# Patient Record
Sex: Female | Born: 1985
Health system: Southern US, Community
[De-identification: ages and names within clinical notes are randomized; demographics above are authoritative.]

## PROBLEM LIST (undated history)

## (undated) ENCOUNTER — Inpatient Hospital Stay (HOSPITAL_COMMUNITY): Payer: Self-pay

## (undated) DIAGNOSIS — L509 Urticaria, unspecified: Secondary | ICD-10-CM

## (undated) DIAGNOSIS — W3400XA Accidental discharge from unspecified firearms or gun, initial encounter: Secondary | ICD-10-CM

## (undated) DIAGNOSIS — K219 Gastro-esophageal reflux disease without esophagitis: Secondary | ICD-10-CM

## (undated) DIAGNOSIS — D649 Anemia, unspecified: Secondary | ICD-10-CM

## (undated) DIAGNOSIS — J45909 Unspecified asthma, uncomplicated: Secondary | ICD-10-CM

## (undated) HISTORY — PX: NO PAST SURGERIES: SHX2092

## (undated) HISTORY — DX: Unspecified asthma, uncomplicated: J45.909

## (undated) HISTORY — DX: Urticaria, unspecified: L50.9

## (undated) HISTORY — DX: Accidental discharge from unspecified firearms or gun, initial encounter: W34.00XA

## (undated) HISTORY — PX: THERAPEUTIC ABORTION: SHX798

---
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
## 2007-03-08 ENCOUNTER — Ambulatory Visit: Payer: Self-pay | Admitting: Internal Medicine

## 2007-05-30 ENCOUNTER — Ambulatory Visit: Payer: Self-pay | Admitting: Family Medicine

## 2008-06-19 ENCOUNTER — Ambulatory Visit: Payer: Self-pay | Admitting: Internal Medicine

## 2008-06-20 ENCOUNTER — Encounter (INDEPENDENT_AMBULATORY_CARE_PROVIDER_SITE_OTHER): Payer: Self-pay | Admitting: Internal Medicine

## 2008-09-12 ENCOUNTER — Ambulatory Visit (HOSPITAL_COMMUNITY): Admission: RE | Admit: 2008-09-12 | Discharge: 2008-09-12 | Payer: Self-pay | Admitting: Obstetrics & Gynecology

## 2008-12-04 ENCOUNTER — Inpatient Hospital Stay (HOSPITAL_COMMUNITY): Admission: AD | Admit: 2008-12-04 | Discharge: 2008-12-07 | Payer: Self-pay | Admitting: Obstetrics & Gynecology

## 2008-12-07 ENCOUNTER — Encounter: Payer: Self-pay | Admitting: Obstetrics & Gynecology

## 2009-02-13 ENCOUNTER — Inpatient Hospital Stay (HOSPITAL_COMMUNITY): Admission: AD | Admit: 2009-02-13 | Discharge: 2009-02-15 | Payer: Self-pay | Admitting: Obstetrics & Gynecology

## 2009-09-17 ENCOUNTER — Emergency Department (HOSPITAL_COMMUNITY): Admission: EM | Admit: 2009-09-17 | Discharge: 2009-09-17 | Payer: Self-pay | Admitting: Emergency Medicine

## 2010-04-07 ENCOUNTER — Encounter: Payer: Self-pay | Admitting: Obstetrics & Gynecology

## 2010-06-01 LAB — DIFFERENTIAL
Basophils Absolute: 0 10*3/uL (ref 0.0–0.1)
Basophils Relative: 0 % (ref 0–1)
Eosinophils Relative: 0 % (ref 0–5)
Lymphocytes Relative: 8 % — ABNORMAL LOW (ref 12–46)
Monocytes Absolute: 0.1 10*3/uL (ref 0.1–1.0)
Neutro Abs: 7.3 10*3/uL (ref 1.7–7.7)

## 2010-06-01 LAB — URINALYSIS, ROUTINE W REFLEX MICROSCOPIC
Bilirubin Urine: NEGATIVE
Ketones, ur: 15 mg/dL — AB
Nitrite: NEGATIVE
Protein, ur: 30 mg/dL — AB

## 2010-06-01 LAB — WET PREP, GENITAL
Clue Cells Wet Prep HPF POC: NONE SEEN
Trich, Wet Prep: NONE SEEN
Yeast Wet Prep HPF POC: NONE SEEN

## 2010-06-01 LAB — CBC
HCT: 31.3 % — ABNORMAL LOW (ref 36.0–46.0)
MCHC: 34.2 g/dL (ref 30.0–36.0)
MCV: 92.3 fL (ref 78.0–100.0)
Platelets: 330 10*3/uL (ref 150–400)
RDW: 13 % (ref 11.5–15.5)
WBC: 8.1 10*3/uL (ref 4.0–10.5)

## 2010-06-01 LAB — POCT I-STAT, CHEM 8
Calcium, Ion: 1.17 mmol/L (ref 1.12–1.32)
HCT: 34 % — ABNORMAL LOW (ref 36.0–46.0)
Sodium: 141 mEq/L (ref 135–145)
TCO2: 23 mmol/L (ref 0–100)

## 2010-06-01 LAB — URINE CULTURE: Colony Count: 75000

## 2010-06-01 LAB — URINE MICROSCOPIC-ADD ON

## 2010-06-01 LAB — GC/CHLAMYDIA PROBE AMP, GENITAL: Chlamydia, DNA Probe: NEGATIVE

## 2010-06-01 LAB — POCT PREGNANCY, URINE: Preg Test, Ur: NEGATIVE

## 2010-06-17 LAB — CBC
HCT: 33.2 % — ABNORMAL LOW (ref 36.0–46.0)
Hemoglobin: 11.3 g/dL — ABNORMAL LOW (ref 12.0–15.0)
Platelets: 291 10*3/uL (ref 150–400)
RBC: 3.46 MIL/uL — ABNORMAL LOW (ref 3.87–5.11)
RBC: 3.81 MIL/uL — ABNORMAL LOW (ref 3.87–5.11)
RDW: 12.9 % (ref 11.5–15.5)
WBC: 13.9 10*3/uL — ABNORMAL HIGH (ref 4.0–10.5)
WBC: 9.3 10*3/uL (ref 4.0–10.5)

## 2010-06-17 LAB — RPR: RPR Ser Ql: NONREACTIVE

## 2010-06-20 LAB — URINALYSIS, ROUTINE W REFLEX MICROSCOPIC
Bilirubin Urine: NEGATIVE
Nitrite: NEGATIVE
Specific Gravity, Urine: 1.01 (ref 1.005–1.030)
Urobilinogen, UA: 0.2 mg/dL (ref 0.0–1.0)

## 2010-06-20 LAB — CBC
HCT: 31.1 % — ABNORMAL LOW (ref 36.0–46.0)
Hemoglobin: 10.6 g/dL — ABNORMAL LOW (ref 12.0–15.0)
MCV: 94.8 fL (ref 78.0–100.0)
RBC: 3.29 MIL/uL — ABNORMAL LOW (ref 3.87–5.11)
WBC: 9.5 10*3/uL (ref 4.0–10.5)

## 2010-06-20 LAB — URINE CULTURE: Colony Count: 8000

## 2010-09-21 ENCOUNTER — Encounter (HOSPITAL_COMMUNITY): Payer: Self-pay | Admitting: *Deleted

## 2010-09-21 ENCOUNTER — Inpatient Hospital Stay (HOSPITAL_COMMUNITY): Payer: Medicaid Other | Admitting: Obstetrics & Gynecology

## 2010-09-21 ENCOUNTER — Inpatient Hospital Stay (HOSPITAL_COMMUNITY)
Admission: AD | Admit: 2010-09-21 | Discharge: 2010-09-21 | Disposition: A | Discharge status: Home / Self Care | Payer: Medicaid Other | Source: Ambulatory Visit | Attending: Obstetrics & Gynecology | Admitting: Obstetrics & Gynecology

## 2010-09-21 ENCOUNTER — Inpatient Hospital Stay (HOSPITAL_COMMUNITY)
Admission: AD | Admit: 2010-09-21 | Payer: Medicaid Other | Source: Ambulatory Visit | Admitting: Obstetrics & Gynecology

## 2010-09-21 DIAGNOSIS — R109 Unspecified abdominal pain: Secondary | ICD-10-CM | POA: Insufficient documentation

## 2010-09-21 DIAGNOSIS — O99891 Other specified diseases and conditions complicating pregnancy: Secondary | ICD-10-CM | POA: Insufficient documentation

## 2010-09-21 DIAGNOSIS — O9989 Other specified diseases and conditions complicating pregnancy, childbirth and the puerperium: Secondary | ICD-10-CM

## 2010-09-21 DIAGNOSIS — Z331 Pregnant state, incidental: Secondary | ICD-10-CM

## 2010-09-21 LAB — URINALYSIS, ROUTINE W REFLEX MICROSCOPIC
Bilirubin Urine: NEGATIVE
Hgb urine dipstick: NEGATIVE
Protein, ur: 30 mg/dL — AB
Urobilinogen, UA: 1 mg/dL (ref 0.0–1.0)

## 2010-09-21 LAB — POCT PREGNANCY, URINE: Preg Test, Ur: POSITIVE

## 2010-09-21 LAB — URINE MICROSCOPIC-ADD ON

## 2010-09-21 NOTE — Progress Notes (Signed)
Pt unable to stay due to ride leaving - advised of possible consequences if ectopic pregnancy, including death.

## 2010-09-21 NOTE — Progress Notes (Signed)
Pt states she woke up this morning with lower and upper abd pain that comes and goes.  Does not know if she is pregnant.  lmp 07-28-10

## 2010-09-21 NOTE — Plan of Care (Signed)
Impression:  Early Pregnancy Abdominal Pain Plan:  Advised ultrasound and labs as soon as possible (pt unable to stay)

## 2010-09-22 LAB — GC/CHLAMYDIA PROBE AMP, GENITAL: GC Probe Amp, Genital: NEGATIVE

## 2010-09-22 NOTE — Progress Notes (Signed)
Dr. Jean Rosenthal notified that patient was unable to stay for HCG and ultrasound.

## 2010-11-24 LAB — RUBELLA ANTIBODY, IGM: Rubella: IMMUNE

## 2010-11-24 LAB — GC/CHLAMYDIA PROBE AMP, GENITAL: Chlamydia: NEGATIVE

## 2010-11-24 LAB — ABO/RH: RH Type: POSITIVE

## 2010-11-24 LAB — HIV ANTIBODY (ROUTINE TESTING W REFLEX): HIV: NONREACTIVE

## 2011-03-17 NOTE — L&D Delivery Note (Signed)
Delivery Note At 3:57 AM a viable female was delivered via Vaginal, Spontaneous Delivery (Presentation: Right Occiput Anterior).  APGAR: 8, 9; weight 6 lb 6.5 oz (2906 g).   Placenta status: Intact, Spontaneous.  Cord: 3 vessels with the following complications: None.   Anesthesia: None  Episiotomy: None Lacerations: Labial Suture Repair: 3.0 vicryl rapide Est. Blood Loss (mL): 250  Mom to postpartum.  Baby to nursery-stable.  JACKSON-MOORE,Kassidee Narciso A 04/27/2011, 4:18 AM

## 2011-04-09 LAB — STREP B DNA PROBE: GBS: NEGATIVE

## 2011-04-27 ENCOUNTER — Inpatient Hospital Stay (HOSPITAL_COMMUNITY)
Admission: AD | Admit: 2011-04-27 | Discharge: 2011-04-29 | DRG: 775 | Disposition: A | Discharge status: Home / Self Care | Payer: Medicaid Other | Source: Ambulatory Visit | Attending: Obstetrics | Admitting: Obstetrics

## 2011-04-27 ENCOUNTER — Encounter (HOSPITAL_COMMUNITY): Payer: Self-pay | Admitting: *Deleted

## 2011-04-27 DIAGNOSIS — Z331 Pregnant state, incidental: Secondary | ICD-10-CM

## 2011-04-27 DIAGNOSIS — IMO0001 Reserved for inherently not codable concepts without codable children: Secondary | ICD-10-CM

## 2011-04-27 LAB — RPR: RPR Ser Ql: NONREACTIVE

## 2011-04-27 LAB — CBC
Hemoglobin: 11.6 g/dL — ABNORMAL LOW (ref 12.0–15.0)
MCH: 30.5 pg (ref 26.0–34.0)
MCV: 90.3 fL (ref 78.0–100.0)
RBC: 3.8 MIL/uL — ABNORMAL LOW (ref 3.87–5.11)

## 2011-04-27 MED ORDER — IBUPROFEN 600 MG PO TABS
600.0000 mg | ORAL_TABLET | Freq: Four times a day (QID) | ORAL | Status: DC
  Administered 2011-04-27 – 2011-04-29 (×9): 600 mg via ORAL
  Filled 2011-04-27 (×8): qty 1

## 2011-04-27 MED ORDER — MEDROXYPROGESTERONE ACETATE 150 MG/ML IM SUSP: 150.0000 mg | INTRAMUSCULAR | Status: DC | PRN

## 2011-04-27 MED ORDER — ONDANSETRON HCL 4 MG/2ML IJ SOLN: 4.0000 mg | INTRAMUSCULAR | Status: DC | PRN

## 2011-04-27 MED ORDER — MAGNESIUM HYDROXIDE 400 MG/5ML PO SUSP: 30.0000 mL | ORAL | Status: DC | PRN

## 2011-04-27 MED ORDER — FLEET ENEMA 7-19 GM/118ML RE ENEM: 1.0000 | ENEMA | RECTAL | Status: DC | PRN

## 2011-04-27 MED ORDER — DIPHENHYDRAMINE HCL 25 MG PO CAPS: 25.0000 mg | ORAL_CAPSULE | Freq: Four times a day (QID) | ORAL | Status: DC | PRN

## 2011-04-27 MED ORDER — LACTATED RINGERS IV SOLN
INTRAVENOUS | Status: DC
  Administered 2011-04-27: 03:00:00 via INTRAVENOUS

## 2011-04-27 MED ORDER — DIBUCAINE 1 % RE OINT: 1.0000 "application " | TOPICAL_OINTMENT | RECTAL | Status: DC | PRN

## 2011-04-27 MED ORDER — WITCH HAZEL-GLYCERIN EX PADS: 1.0000 "application " | MEDICATED_PAD | CUTANEOUS | Status: DC | PRN

## 2011-04-27 MED ORDER — MEASLES, MUMPS & RUBELLA VAC ~~LOC~~ INJ
0.5000 mL | INJECTION | Freq: Once | SUBCUTANEOUS | Status: DC
  Filled 2011-04-27: qty 0.5

## 2011-04-27 MED ORDER — ACETAMINOPHEN 325 MG PO TABS: 650.0000 mg | ORAL_TABLET | ORAL | Status: DC | PRN

## 2011-04-27 MED ORDER — ONDANSETRON HCL 4 MG/2ML IJ SOLN: 4.0000 mg | Freq: Four times a day (QID) | INTRAMUSCULAR | Status: DC | PRN

## 2011-04-27 MED ORDER — INFLUENZA VIRUS VACC SPLIT PF IM SUSP: 0.5000 mL | INTRAMUSCULAR | Status: DC | PRN

## 2011-04-27 MED ORDER — PRENATAL MULTIVITAMIN CH
1.0000 | ORAL_TABLET | Freq: Every day | ORAL | Status: DC
  Administered 2011-04-27 – 2011-04-29 (×4): 1 via ORAL
  Filled 2011-04-27 (×4): qty 1

## 2011-04-27 MED ORDER — OXYCODONE-ACETAMINOPHEN 5-325 MG PO TABS
1.0000 | ORAL_TABLET | ORAL | Status: DC | PRN
  Administered 2011-04-27 (×2): 1 via ORAL
  Filled 2011-04-27 (×2): qty 1

## 2011-04-27 MED ORDER — BENZOCAINE-MENTHOL 20-0.5 % EX AERO
INHALATION_SPRAY | CUTANEOUS | Status: AC
  Filled 2011-04-27: qty 56

## 2011-04-27 MED ORDER — TETANUS-DIPHTH-ACELL PERTUSSIS 5-2.5-18.5 LF-MCG/0.5 IM SUSP: 0.5000 mL | Freq: Once | INTRAMUSCULAR | Status: DC

## 2011-04-27 MED ORDER — BENZOCAINE-MENTHOL 20-0.5 % EX AERO: 1.0000 "application " | INHALATION_SPRAY | CUTANEOUS | Status: DC | PRN

## 2011-04-27 MED ORDER — CITRIC ACID-SODIUM CITRATE 334-500 MG/5ML PO SOLN: 30.0000 mL | ORAL | Status: DC | PRN

## 2011-04-27 MED ORDER — BUTORPHANOL TARTRATE 2 MG/ML IJ SOLN
1.0000 mg | INTRAMUSCULAR | Status: DC | PRN
  Administered 2011-04-27: 1 mg via INTRAVENOUS
  Filled 2011-04-27: qty 1

## 2011-04-27 MED ORDER — LANOLIN HYDROUS EX OINT: TOPICAL_OINTMENT | CUTANEOUS | Status: DC | PRN

## 2011-04-27 MED ORDER — ONDANSETRON HCL 4 MG PO TABS: 4.0000 mg | ORAL_TABLET | ORAL | Status: DC | PRN

## 2011-04-27 MED ORDER — IBUPROFEN 600 MG PO TABS: 600.0000 mg | ORAL_TABLET | Freq: Four times a day (QID) | ORAL | Status: DC | PRN

## 2011-04-27 MED ORDER — LACTATED RINGERS IV SOLN: 500.0000 mL | INTRAVENOUS | Status: DC | PRN

## 2011-04-27 MED ORDER — OXYTOCIN 20 UNITS IN LACTATED RINGERS INFUSION - SIMPLE
125.0000 mL/h | Freq: Once | INTRAVENOUS | Status: AC
  Administered 2011-04-27: 999 mL/h via INTRAVENOUS

## 2011-04-27 MED ORDER — ZOLPIDEM TARTRATE 5 MG PO TABS: 5.0000 mg | ORAL_TABLET | Freq: Every evening | ORAL | Status: DC | PRN

## 2011-04-27 MED ORDER — OXYCODONE-ACETAMINOPHEN 5-325 MG PO TABS: 1.0000 | ORAL_TABLET | ORAL | Status: DC | PRN

## 2011-04-27 MED ORDER — OXYTOCIN BOLUS FROM INFUSION
500.0000 mL | Freq: Once | INTRAVENOUS | Status: DC
  Filled 2011-04-27: qty 1000
  Filled 2011-04-27: qty 500

## 2011-04-27 MED ORDER — LIDOCAINE HCL (PF) 1 % IJ SOLN
30.0000 mL | INTRAMUSCULAR | Status: AC | PRN
  Administered 2011-04-27: 30 mL via SUBCUTANEOUS
  Filled 2011-04-27: qty 30

## 2011-04-27 MED ORDER — FERROUS SULFATE 325 (65 FE) MG PO TABS
325.0000 mg | ORAL_TABLET | Freq: Two times a day (BID) | ORAL | Status: DC
  Administered 2011-04-27 – 2011-04-29 (×5): 325 mg via ORAL
  Filled 2011-04-27 (×6): qty 1

## 2011-04-27 MED ORDER — SENNOSIDES-DOCUSATE SODIUM 8.6-50 MG PO TABS
2.0000 | ORAL_TABLET | Freq: Every day | ORAL | Status: DC
  Administered 2011-04-27 – 2011-04-28 (×2): 2 via ORAL

## 2011-04-27 NOTE — Progress Notes (Signed)
Dr Tamela Oddi at desk, informed pt would like to push and to come for delivery, she said pt may push and to call her when ready for delivery

## 2011-04-27 NOTE — H&P (Signed)
[name] is a 25 y.o. female presenting for contractions. Maternal Medical History:  Reason for admission: Reason for admission: contractions.  Contractions: Frequency: regular.    Fetal activity: Perceived fetal activity is normal.    Prenatal complications: Substance abuse.   THC    OB History    Grav Para Term Preterm Abortions TAB SAB Ect Mult Living   3 1 1  1 1    1      Past Medical History  Diagnosis Date  . Preterm labor    Past Surgical History  Procedure Date  . Therapeutic abortion   . No past surgeries    Family History: family history includes Alcohol abuse in her maternal grandfather; Diabetes in her maternal grandmother and paternal grandmother; Hypertension in her maternal grandmother; Kidney disease in her maternal grandfather; and Stroke in her father. Social History:  reports that she has been smoking.  She has never used smokeless tobacco. She reports that she drinks about .5 ounces of alcohol per week. She reports that she uses illicit drugs (Marijuana).  Review of Systems  Constitutional: Negative for fever.  Eyes: Negative for blurred vision.  Respiratory: Negative for shortness of breath.   Gastrointestinal: Negative for vomiting.  Skin: Negative for rash.  Neurological: Negative for headaches.    Dilation: 8 Effacement (%): 100 Station: -1 Exam by:: a tuttle rn Blood pressure 77/50, pulse 89, resp. rate 20, height 5\' 3"  (1.6 m), weight 61.689 kg (136 lb), last menstrual period 07/28/2010. Maternal Exam:  Uterine Assessment: Contraction strength is firm.  Contraction frequency is regular.   Abdomen: Patient reports no abdominal tenderness. Fetal presentation: vertex  Introitus: not evaluated.   Cervix: Cervix evaluated by digital exam.     Fetal Exam Fetal Monitor Review: Variability: moderate (6-25 bpm).   Pattern: accelerations present and no decelerations.    Fetal State Assessment: Category I - tracings are  normal.     Physical Exam  Constitutional: She appears well-developed.  HENT:  Head: Normocephalic.  Neck: Neck supple. No thyromegaly present.  Cardiovascular: Normal rate and regular rhythm.   Respiratory: Breath sounds normal.  GI: Soft. Bowel sounds are normal.  Skin: No rash noted.    Prenatal labs: ABO, Rh:   Antibody:   Rubella:   RPR:    HBsAg:    HIV:    GBS:     Assessment/Plan: Primipara at term, active labor, Category 1 FHT Admit, anticipate an NSVD   [name],Tanya Artiga A 04/27/2011, 3:46 AM

## 2011-04-27 NOTE — Progress Notes (Signed)
UR chart review completed.  

## 2011-04-27 NOTE — Progress Notes (Signed)
Dr Tamela Oddi came to room, informed pt would like to push, she will remain on unit, to call when ready for delivery

## 2011-04-28 LAB — CBC
HCT: 31 % — ABNORMAL LOW (ref 36.0–46.0)
Hemoglobin: 10.4 g/dL — ABNORMAL LOW (ref 12.0–15.0)
MCHC: 33.5 g/dL (ref 30.0–36.0)

## 2011-04-28 NOTE — Progress Notes (Signed)
PSYCHOSOCIAL ASSESSMENT ~ MATERNAL/CHILD  Name: [name] Age: 26  Referral Date: 04/28/11  Reason/Source: History of MJ use / CN  I. FAMILY/HOME ENVIRONMENT  A. Child's Legal Guardian _X__Parent(s) ___Grandparent ___Foster parent ___DSS_________________  Name: Rhona Raider DOB: // Age: 18  Address: [address]; [place], [place] [address]  Name: Alphonzo Lemmings DOB: // Age: 24  Address:  B. Other Household Members/Support Persons Name: Evanny Ellerbe Relationship: son DOB 02/13/09  Name: Relationship: DOB ___/___/___  Name: Relationship: DOB ___/___/___  Name: Relationship: DOB ___/___/___  C. Other Support:  II. PSYCHOSOCIAL DATA A. Information Source _X_Patient Interview __Family Interview __Other___________ B. Event organiser _X_Employment: [employer]  _X_Medicaid [employer]: [place] __Private Insurance: __Self Pay  _X_Food Stamps _X_WIC __Work First __Public Housing _X_Section 8  _X_Maternity Care Coordination/Child Service Coordination/Early Intervention: Ferdie Ping  ___School: Grade:  __Other:  Salena Saner Cultural and Environment Information Cultural Issues Impacting Care:  III. STRENGTHS _X__Supportive family/friends  _X__Adequate Resources  ___Compliance with medical plan  _X__Home prepared for Child (including basic supplies)  ___Understanding of illness  ___Other:  RISK FACTORS AND CURRENT PROBLEMS ____No Problems Noted  History of MJ  IV. SOCIAL WORK ASSESSMENT Pt admits to smoking MJ, "every other day," prior to pregnancy confirmation at 7 weeks. Pt states that she was able to stop smoking for 2 months and noticed that her appetite decreased. As a result, she started smoking MJ again, estimating that she used "once or twice a week" to help with appetite and nausea. Pt states the prescription she had to treat nausea, did not work. Last time she used was in October or November. She denies other illegal substance or Etoh use. Pt identified her  father, Mikeal Hawthorne, as her primary support person. She has all the necessary supplies for the infant. FOB is involved, as per the pt. Sw observed pt bonding well with the infant. Pt was very pleasant and spoke openly with Sw. Sw will follow up with drug screen results and make a referral if needed. She denies CPS history.  V. SOCIAL WORK PLAN _X__No Further Intervention Required/No Barriers to Discharge  ___Psychosocial Support and Ongoing Assessment of Needs  ___Patient/Family Education:  ___Child Protective Services Report County___________ Date___/____/____  ___Information/Referral to [employer]_________________________  ___Other:

## 2011-04-28 NOTE — Progress Notes (Signed)
Patient ID: [name], female   DOB: 03-Aug-1985, 26 y.o.   MRN: [id number] Postpartum day one Vital signs normal Fundus firm Lochia moderate Legs negative Patient has no complaints and and

## 2011-04-29 NOTE — Discharge Summary (Signed)
Obstetric Discharge Summary Reason for Admission: onset of labor Prenatal Procedures: none Intrapartum Procedures: spontaneous vaginal delivery Postpartum Procedures: none Complications-Operative and Postpartum: none Hemoglobin  Date Value Range Status  04/28/2011 10.4* 12.0-15.0 (g/dL) Final     HCT  Date Value Range Status  04/28/2011 31.0* 36.0-46.0 (%) Final    Discharge Diagnoses: Term Pregnancy-delivered  Discharge Information: Date: 04/29/2011 Activity: pelvic rest Diet: routine Medications: Percocet Condition: stable Instructions: refer to practice specific booklet Discharge to: home Follow-up Information    Follow up with Miguelina Fore A, MD. Call in 6 weeks.   Contact information:   [address] [place] [address] [phone]          Newborn Data: Live born female  Birth Weight: 6 lb 6.5 oz (2906 g) APGAR: 8, 9  Home with mother.  Harvest Deist A 04/29/2011, 6:59 AM

## 2011-04-29 NOTE — Discharge Instructions (Signed)
Discharge instructions   You can wash your hair  Shower  Eat what you want  Drink what you want  See me in 6 weeks  Your ankles are going to swell more in the next 2 weeks than when pregnant  No sex for 6 weeks   Derris Millan A, MD 04/29/2011

## 2012-03-23 ENCOUNTER — Encounter: Payer: Self-pay | Admitting: *Deleted

## 2012-03-24 ENCOUNTER — Encounter: Payer: Medicaid Other | Admitting: Advanced Practice Midwife

## 2012-03-25 ENCOUNTER — Encounter: Payer: Medicaid Other | Admitting: Medical

## 2012-12-27 ENCOUNTER — Inpatient Hospital Stay (HOSPITAL_COMMUNITY)
Admission: AD | Admit: 2012-12-27 | Discharge: 2012-12-27 | Disposition: A | Discharge status: Home / Self Care | Payer: Medicaid Other | Source: Ambulatory Visit | Attending: Obstetrics & Gynecology | Admitting: Obstetrics & Gynecology

## 2012-12-27 ENCOUNTER — Encounter (HOSPITAL_COMMUNITY): Payer: Self-pay | Admitting: *Deleted

## 2012-12-27 DIAGNOSIS — B9689 Other specified bacterial agents as the cause of diseases classified elsewhere: Secondary | ICD-10-CM | POA: Insufficient documentation

## 2012-12-27 DIAGNOSIS — A499 Bacterial infection, unspecified: Secondary | ICD-10-CM | POA: Insufficient documentation

## 2012-12-27 DIAGNOSIS — O2390 Unspecified genitourinary tract infection in pregnancy, unspecified trimester: Secondary | ICD-10-CM | POA: Insufficient documentation

## 2012-12-27 DIAGNOSIS — O26899 Other specified pregnancy related conditions, unspecified trimester: Secondary | ICD-10-CM

## 2012-12-27 DIAGNOSIS — R109 Unspecified abdominal pain: Secondary | ICD-10-CM | POA: Insufficient documentation

## 2012-12-27 DIAGNOSIS — N949 Unspecified condition associated with female genital organs and menstrual cycle: Secondary | ICD-10-CM

## 2012-12-27 DIAGNOSIS — N760 Acute vaginitis: Secondary | ICD-10-CM | POA: Insufficient documentation

## 2012-12-27 DIAGNOSIS — O9989 Other specified diseases and conditions complicating pregnancy, childbirth and the puerperium: Secondary | ICD-10-CM

## 2012-12-27 HISTORY — DX: Anemia, unspecified: D64.9

## 2012-12-27 LAB — URINE MICROSCOPIC-ADD ON

## 2012-12-27 LAB — POCT PREGNANCY, URINE: Preg Test, Ur: POSITIVE — AB

## 2012-12-27 LAB — WET PREP, GENITAL

## 2012-12-27 LAB — URINALYSIS, ROUTINE W REFLEX MICROSCOPIC
Glucose, UA: NEGATIVE mg/dL
Hgb urine dipstick: NEGATIVE
Protein, ur: NEGATIVE mg/dL
Specific Gravity, Urine: 1.015 (ref 1.005–1.030)

## 2012-12-27 MED ORDER — METRONIDAZOLE 500 MG PO TABS: 500.0000 mg | ORAL_TABLET | Freq: Two times a day (BID) | ORAL | Status: DC

## 2012-12-27 NOTE — MAU Note (Signed)
Been having pain in lower stomach for about a wk.  Missed period.  Has done 2 home tests, one was neg, one was faintly positive- over a month ago.

## 2012-12-27 NOTE — MAU Note (Signed)
Pt now in lobby. Explained we called her earlier and pt not in lobby. Now have OB pts who need to come first. Pt asked how long the wait and unable to give a definite time. Pt states she may leave (pt has several children with her)

## 2012-12-27 NOTE — MAU Note (Signed)
Pt called, not in lobby 

## 2012-12-27 NOTE — MAU Provider Note (Signed)
Chief Complaint: Abdominal Pain and Possible Pregnancy   First Provider Initiated Contact with Patient 12/27/12 1820     SUBJECTIVE HPI: [name] is a 27 y.o. [id number] at [redacted]w[redacted]d by LMP who presents with 1 wk hx of lower abdominal pain. She indicates suprapubic region and describes it as crampy and continuous but waxing and waning. Worse with movement or walking. She denies irritative vaginal discharge. No dysuria, hematuria, urinary urgency or frequency.  Past Medical History  Diagnosis Date  . Anemia    OB History  Gravida Para Term Preterm AB SAB TAB Ectopic Multiple Living  5 2 2  1  1   2     # Outcome Date GA Lbr Len/2nd Weight Sex Delivery Anes PTL Lv  5 CUR           4 TRM 04/27/11 [redacted]w[redacted]d 01:56 / 00:01 2.906 kg (6 lb 6.5 oz) M SVD None  Y  3 TRM 2010 [redacted]w[redacted]d   M SVD None  Y  2 TAB           1 GRA              Comments: System Generated. Please review and update pregnancy details.     Past Surgical History  Procedure Laterality Date  . Therapeutic abortion    . No past surgeries     History   Social History  . Marital Status: Single    Spouse Name: N/A    Number of Children: N/A  . Years of Education: N/A   Occupational History  . Not on file.   Social History Main Topics  . Smoking status: Former Smoker    Quit date: 11/10/2012  . Smokeless tobacco: Never Used  . Alcohol Use: 0.5 oz/week    1 drink(s) per week     Comment: smokes 2-3 joints a day  . Drug Use: Yes    Special: Marijuana  . Sexual Activity: Yes    Birth Control/ Protection: Condom   Other Topics Concern  . Not on file   Social History Narrative  . No narrative on file   No current facility-administered medications on file prior to encounter.   No current outpatient prescriptions on file prior to encounter.   Allergies  Allergen Reactions  . Latex Rash    ROS: Pertinent items in HPI  OBJECTIVE Blood pressure 110/53, pulse 79, temperature 98.1 F (36.7 C), temperature source  Oral, resp. rate 18, height 5' 2.5" (1.588 m), weight 53.978 kg (119 lb), last menstrual period 10/16/2012, unknown if currently breastfeeding. GENERAL: Well-developed, well-nourished female in no acute distress.  HEENT: Normocephalic HEART: normal rate RESP: normal effort ABDOMEN: Soft, non-tender. DT FHR 155 EXTREMITIES: Nontender, no edema NEURO: Alert and oriented SPECULUM EXAM: NEFG, physiologic discharge, no blood noted, cervix clean BIMANUAL: cervix L/C uterus 10-12 wk size, no adnexal tenderness or masses  LAB RESULTS Results for orders placed during the hospital encounter of 12/27/12 (from the past 24 hour(s))  URINALYSIS, ROUTINE W REFLEX MICROSCOPIC     Status: Abnormal   Collection Time    12/27/12  5:25 PM      Result Value Range   Color, Urine YELLOW  YELLOW   APPearance CLEAR  CLEAR   Specific Gravity, Urine 1.015  1.005 - 1.030   pH 7.5  5.0 - 8.0   Glucose, UA NEGATIVE  NEGATIVE mg/dL   Hgb urine dipstick NEGATIVE  NEGATIVE   Bilirubin Urine NEGATIVE  NEGATIVE   Ketones, ur  NEGATIVE  NEGATIVE mg/dL   Protein, ur NEGATIVE  NEGATIVE mg/dL   Urobilinogen, UA 0.2  0.0 - 1.0 mg/dL   Nitrite NEGATIVE  NEGATIVE   Leukocytes, UA TRACE (*) NEGATIVE  URINE MICROSCOPIC-ADD ON     Status: Abnormal   Collection Time    12/27/12  5:25 PM      Result Value Range   Squamous Epithelial / LPF MANY (*) RARE   WBC, UA 0-2  <3 WBC/hpf  POCT PREGNANCY, URINE     Status: Abnormal   Collection Time    12/27/12  5:29 PM      Result Value Range   Preg Test, Ur POSITIVE (*) NEGATIVE  WET PREP, GENITAL     Status: Abnormal   Collection Time    12/27/12  6:46 PM      Result Value Range   Yeast Wet Prep HPF POC NONE SEEN  NONE SEEN   Trich, Wet Prep NONE SEEN  NONE SEEN   Clue Cells Wet Prep HPF POC MODERATE (*) NONE SEEN   WBC, Wet Prep HPF POC FEW (*) NONE SEEN    IMAGING No results found.  MAU COURSE Urine culture, GC/CT sent  ASSESSMENT 1. Abdominal pain in pregnancy,  antepartum   2. Round ligament pain   3. BV (bacterial vaginosis)   27 yo Z6X0960 at [redacted]w[redacted]d  PLAN Discharge home    Medication List         ferrous sulfate 325 (65 FE) MG tablet  Take 325 mg by mouth daily with breakfast.     prenatal multivitamin Tabs tablet  Take 1 tablet by mouth daily at 12 noon.         Danae Orleans, CNM 12/27/2012  6:23 PM

## 2012-12-28 LAB — GC/CHLAMYDIA PROBE AMP
CT Probe RNA: NEGATIVE
GC Probe RNA: NEGATIVE

## 2012-12-28 LAB — URINE CULTURE: Colony Count: 30000

## 2012-12-30 NOTE — MAU Provider Note (Signed)
Attestation of Attending Supervision of Advanced Practitioner (CNM/NP): Evaluation and management procedures were performed by the Advanced Practitioner under my supervision and collaboration.  I have reviewed the Advanced Practitioner's note and chart, and I agree with the management and plan.  HARRAWAY-SMITH, Gretta Samons 8:24 AM     

## 2013-01-02 ENCOUNTER — Encounter: Payer: Self-pay | Admitting: Obstetrics and Gynecology

## 2013-01-02 ENCOUNTER — Telehealth: Payer: Self-pay | Admitting: Obstetrics and Gynecology

## 2013-01-02 NOTE — Telephone Encounter (Signed)
Message copied by Gerome Apley on Mon Jan 02, 2013  4:35 PM ------      Message from: POE, DEIRDRE C      Created: Thu Dec 29, 2012  3:51 PM       Please call in Flagyl 500 bid x 7d ------

## 2013-01-02 NOTE — Telephone Encounter (Addendum)
Another nurse got a hold of patient. Resolved.   Message copied by Toula Moos on Mon Jan 02, 2013  4:27 PM ------      Message from: POE, DEIRDRE C      Created: Thu Dec 29, 2012  3:51 PM       Please call in Flagyl 500 bid x 7d ------

## 2013-01-02 NOTE — Telephone Encounter (Signed)
Called [name] to notify of BV and need for flagyl, but she states she was already notified.

## 2013-01-11 ENCOUNTER — Other Ambulatory Visit: Payer: Self-pay | Admitting: Obstetrics and Gynecology

## 2013-01-11 LAB — OB RESULTS CONSOLE RUBELLA ANTIBODY, IGM: Rubella: IMMUNE

## 2013-01-11 LAB — OB RESULTS CONSOLE GC/CHLAMYDIA
CHLAMYDIA, DNA PROBE: NEGATIVE
GC PROBE AMP, GENITAL: NEGATIVE

## 2013-01-11 LAB — OB RESULTS CONSOLE ABO/RH: RH Type: POSITIVE

## 2013-01-11 LAB — OB RESULTS CONSOLE HIV ANTIBODY (ROUTINE TESTING): HIV: NONREACTIVE

## 2013-01-11 LAB — OB RESULTS CONSOLE ANTIBODY SCREEN: ANTIBODY SCREEN: NEGATIVE

## 2013-01-11 LAB — OB RESULTS CONSOLE HEPATITIS B SURFACE ANTIGEN: Hepatitis B Surface Ag: NEGATIVE

## 2013-01-11 LAB — OB RESULTS CONSOLE RPR: RPR: NONREACTIVE

## 2013-01-19 ENCOUNTER — Other Ambulatory Visit: Payer: Self-pay

## 2013-01-23 ENCOUNTER — Encounter (HOSPITAL_COMMUNITY): Payer: Self-pay | Admitting: *Deleted

## 2013-01-23 ENCOUNTER — Inpatient Hospital Stay (HOSPITAL_COMMUNITY)
Admission: AD | Admit: 2013-01-23 | Discharge: 2013-01-23 | Disposition: A | Discharge status: Home / Self Care | Payer: Medicaid Other | Source: Ambulatory Visit | Attending: Obstetrics & Gynecology | Admitting: Obstetrics & Gynecology

## 2013-01-23 DIAGNOSIS — O99340 Other mental disorders complicating pregnancy, unspecified trimester: Secondary | ICD-10-CM | POA: Insufficient documentation

## 2013-01-23 DIAGNOSIS — O26859 Spotting complicating pregnancy, unspecified trimester: Secondary | ICD-10-CM | POA: Insufficient documentation

## 2013-01-23 DIAGNOSIS — R109 Unspecified abdominal pain: Secondary | ICD-10-CM | POA: Insufficient documentation

## 2013-01-23 DIAGNOSIS — F1210 Cannabis abuse, uncomplicated: Secondary | ICD-10-CM | POA: Insufficient documentation

## 2013-01-23 DIAGNOSIS — O210 Mild hyperemesis gravidarum: Secondary | ICD-10-CM | POA: Insufficient documentation

## 2013-01-23 LAB — URINALYSIS, ROUTINE W REFLEX MICROSCOPIC
Ketones, ur: NEGATIVE mg/dL
Leukocytes, UA: NEGATIVE
Nitrite: NEGATIVE
Protein, ur: NEGATIVE mg/dL

## 2013-01-23 NOTE — MAU Note (Signed)
Reports she smokes marijuana daily to help with nausea. Is not taking any anti emetic; spotting stopped from yesterday and low abd pain has subsided while she has been waiting in lobby.

## 2013-01-23 NOTE — MAU Note (Signed)
Vomiting since yesterday, unable to hold down anything, spotting last night.

## 2013-01-23 NOTE — MAU Provider Note (Signed)
HPI:   Pt states she is unable to stay to be seen, says she has a lot of issues going on at home and does not have time. Pt requested to sign out AMA. The currently denies pain or bleeding at this time.   GENERAL: Well-developed, well-nourished female in no acute distress.  HEENT: Normocephalic, atraumatic.   LUNGS: Effort normal HEART: Regular rate  SKIN: Warm, dry and without erythema PSYCH: Normal mood and affect  Filed Vitals:   01/23/13 1250 01/23/13 1539  BP: 98/54 110/60  Pulse: 78 76  Temp: 98.4 F (36.9 C) 98.4 F (36.9 C)  TempSrc: Oral Oral  Resp: 16 16  Height: 5\' 3"  (1.6 m)   Weight: 54.159 kg (119 lb 6.4 oz)     A:  Pt to sign out AMA  P: Pt encouraged to call her primary care OBGYN tomorrow to schedule an appointment. Dr. Arlyce Dice notified of the patients visit   Tanya Hansen [name], NP 01/23/2013 4:14 PM

## 2013-01-26 ENCOUNTER — Emergency Department (HOSPITAL_COMMUNITY)
Admission: EM | Admit: 2013-01-26 | Discharge: 2013-01-26 | Disposition: A | Discharge status: Home / Self Care | Payer: Medicaid Other | Attending: Emergency Medicine | Admitting: Emergency Medicine

## 2013-01-26 ENCOUNTER — Encounter (HOSPITAL_COMMUNITY): Payer: Self-pay | Admitting: Emergency Medicine

## 2013-01-26 DIAGNOSIS — T7840XA Allergy, unspecified, initial encounter: Secondary | ICD-10-CM

## 2013-01-26 DIAGNOSIS — T550X1A Toxic effect of soaps, accidental (unintentional), initial encounter: Secondary | ICD-10-CM | POA: Insufficient documentation

## 2013-01-26 DIAGNOSIS — D649 Anemia, unspecified: Secondary | ICD-10-CM | POA: Insufficient documentation

## 2013-01-26 DIAGNOSIS — Y9289 Other specified places as the place of occurrence of the external cause: Secondary | ICD-10-CM | POA: Insufficient documentation

## 2013-01-26 DIAGNOSIS — O99019 Anemia complicating pregnancy, unspecified trimester: Secondary | ICD-10-CM | POA: Insufficient documentation

## 2013-01-26 DIAGNOSIS — T450X5A Adverse effect of antiallergic and antiemetic drugs, initial encounter: Secondary | ICD-10-CM | POA: Insufficient documentation

## 2013-01-26 DIAGNOSIS — R110 Nausea: Secondary | ICD-10-CM | POA: Insufficient documentation

## 2013-01-26 DIAGNOSIS — L500 Allergic urticaria: Secondary | ICD-10-CM | POA: Insufficient documentation

## 2013-01-26 DIAGNOSIS — Y9389 Activity, other specified: Secondary | ICD-10-CM | POA: Insufficient documentation

## 2013-01-26 DIAGNOSIS — Z87891 Personal history of nicotine dependence: Secondary | ICD-10-CM | POA: Insufficient documentation

## 2013-01-26 DIAGNOSIS — O9989 Other specified diseases and conditions complicating pregnancy, childbirth and the puerperium: Secondary | ICD-10-CM | POA: Insufficient documentation

## 2013-01-26 DIAGNOSIS — IMO0002 Reserved for concepts with insufficient information to code with codable children: Secondary | ICD-10-CM | POA: Insufficient documentation

## 2013-01-26 DIAGNOSIS — R220 Localized swelling, mass and lump, head: Secondary | ICD-10-CM | POA: Insufficient documentation

## 2013-01-26 DIAGNOSIS — Z91040 Latex allergy status: Secondary | ICD-10-CM | POA: Insufficient documentation

## 2013-01-26 DIAGNOSIS — Z79899 Other long term (current) drug therapy: Secondary | ICD-10-CM | POA: Insufficient documentation

## 2013-01-26 MED ORDER — FAMOTIDINE 20 MG PO TABS: 20.0000 mg | ORAL_TABLET | Freq: Two times a day (BID) | ORAL | Status: DC

## 2013-01-26 MED ORDER — DIPHENHYDRAMINE HCL 50 MG/ML IJ SOLN
25.0000 mg | Freq: Once | INTRAMUSCULAR | Status: AC
  Administered 2013-01-26: 25 mg via INTRAVENOUS
  Filled 2013-01-26: qty 1

## 2013-01-26 MED ORDER — DIPHENHYDRAMINE HCL 25 MG PO TABS: 25.0000 mg | ORAL_TABLET | Freq: Four times a day (QID) | ORAL | Status: DC

## 2013-01-26 MED ORDER — FAMOTIDINE IN NACL 20-0.9 MG/50ML-% IV SOLN
20.0000 mg | Freq: Once | INTRAVENOUS | Status: AC
  Administered 2013-01-26: 20 mg via INTRAVENOUS
  Filled 2013-01-26: qty 50

## 2013-01-26 MED ORDER — SODIUM CHLORIDE 0.9 % IV BOLUS (SEPSIS)
1000.0000 mL | Freq: Once | INTRAVENOUS | Status: AC
  Administered 2013-01-26: 1000 mL via INTRAVENOUS

## 2013-01-26 MED ORDER — PREDNISONE 10 MG PO TABS: 40.0000 mg | ORAL_TABLET | Freq: Every day | ORAL | Status: DC

## 2013-01-26 MED ORDER — ONDANSETRON HCL 4 MG/2ML IJ SOLN
4.0000 mg | Freq: Once | INTRAMUSCULAR | Status: AC
  Administered 2013-01-26: 4 mg via INTRAVENOUS
  Filled 2013-01-26: qty 2

## 2013-01-26 MED ORDER — PREDNISONE 20 MG PO TABS
60.0000 mg | ORAL_TABLET | Freq: Once | ORAL | Status: AC
  Administered 2013-01-26: 60 mg via ORAL
  Filled 2013-01-26: qty 3

## 2013-01-26 NOTE — ED Provider Notes (Signed)
CSN: [id number]     Arrival date & time 01/26/13  0848 History   First MD Initiated Contact with Patient 01/26/13 [phone]     Chief Complaint  Patient presents with  . Urticaria   (Consider location/radiation/quality/duration/timing/severity/associated sxs/prior Treatment) HPI  This is a 27 year old female approximately [redacted] weeks pregnant who presents with an allergic reaction. Patient states that she woke up at approximately 5:00 this morning with a right swollen lip and hives on her arms and legs. She denies any shortness of breath or difficulty swallowing. She took 50 mg of Benadryl at 8:30. She denies any new ingestions but does state that she changed her laundry detergent. She has no known allergies. Patient reports some improvement of lip swelling with Benadryl.  Patient has seen her OB regarding her pregnancy and reports no complications. She denies any abdominal pain, vaginal bleeding. Patient does state that she's a little nauseous but she did not eat breakfast this morning and she sometimes gets nauseated when she doesn't eat.  Past Medical History  Diagnosis Date  . Anemia    Past Surgical History  Procedure Laterality Date  . Therapeutic abortion    . No past surgeries     Family History  Problem Relation Age of Onset  . Stroke Father   . Diabetes Maternal Grandmother   . Hypertension Maternal Grandmother   . Alcohol abuse Maternal Grandfather   . Kidney disease Maternal Grandfather   . Diabetes Paternal Grandmother    History  Substance Use Topics  . Smoking status: Former Smoker    Quit date: 11/10/2012  . Smokeless tobacco: Never Used  . Alcohol Use: 0.5 oz/week    1 drink(s) per week     Comment: smokes 2-3 joints a day, last drink was in May 2014   OB History   Grav Para Term Preterm Abortions TAB SAB Ect Mult Living   5 2 2  1 1    2      Review of Systems  Constitutional: Negative for fever.  Respiratory: Negative for choking, chest tightness, shortness of  breath, wheezing and stridor.   Cardiovascular: Negative for chest pain.  Gastrointestinal: Positive for nausea. Negative for vomiting and abdominal pain.  Genitourinary: Negative for dysuria and vaginal bleeding.  Skin: Positive for rash.  Neurological: Negative for headaches.  All other systems reviewed and are negative.    Allergies  Latex  Home Medications   Current Outpatient Rx  Name  Route  Sig  Dispense  Refill  . diphenhydrAMINE (BENADRYL) 25 MG tablet   Oral   Take 50 mg by mouth every 6 (six) hours as needed for allergies.          . ferrous sulfate 325 (65 FE) MG tablet   Oral   Take 325 mg by mouth daily with breakfast.         . Prenatal Vit-Fe Fumarate-FA (PRENATAL MULTIVITAMIN) TABS tablet   Oral   Take 1 tablet by mouth daily at 12 noon.         . diphenhydrAMINE (BENADRYL) 25 MG tablet   Oral   Take 1 tablet (25 mg total) by mouth every 6 (six) hours.   20 tablet   0   . famotidine (PEPCID) 20 MG tablet   Oral   Take 1 tablet (20 mg total) by mouth 2 (two) times daily.   30 tablet   0   . predniSONE (DELTASONE) 10 MG tablet   Oral   Take 4 tablets (  40 mg total) by mouth daily.   16 tablet   0    BP 124/76  Pulse 72  Temp(Src) 98.7 F (37.1 C) (Oral)  Resp 16  SpO2 98%  LMP 10/16/2012 Physical Exam  Nursing note and vitals reviewed. Constitutional: She is oriented to person, place, and time. She appears well-developed and well-nourished.  Tearful, no acute distress  HENT:  Head: Normocephalic and atraumatic.  Mouth/Throat: Oropharynx is clear and moist.  No tongue swelling noted, there is asymmetric swelling of the upper lip left greater than right  Eyes: Pupils are equal, round, and reactive to light.  Neck: Neck supple.  Cardiovascular: Normal rate, regular rhythm and normal heart sounds.   No murmur heard. Pulmonary/Chest: Effort normal and breath sounds normal. No stridor. No respiratory distress. She has no wheezes.   Abdominal: Soft. Bowel sounds are normal. There is no tenderness.  Uterus palpated just above the pubic symphysis  Musculoskeletal: She exhibits no edema.  Neurological: She is alert and oriented to person, place, and time.  Skin: Skin is warm and dry.  Large patches of urticaria noted over the bilateral arms and legs  Psychiatric: She has a normal mood and affect.    ED Course  Procedures (including critical care time) Labs Review Labs Reviewed - No data to display Imaging Review No results found.  EKG Interpretation   None       MDM   1. Acute allergic reaction, initial encounter     Patient received Pepcid, Benadryl, and prednisone. At this time she has no evidence of wheezing or systemic shock. Epinephrine will be held.  12:46 PM Patient has been here approximately 4 hours. She reports that she feels better. Objectively, she is no more evidence of rash and her lip swelling has improved. She is requesting food. She is able to tolerate a by mouth challenge. Anticipate the patient will be discharged home with Benadryl, Pepcid, and a short course of prednisone. She will be instructed to followup with her OB. She was also instructed to rewash her clothes in a detergent she has tolerated in the past.  After history, exam, and medical workup I feel the patient has been appropriately medically screened and is safe for discharge home. Pertinent diagnoses were discussed with the patient. Patient was given return precautions.   Shon Baton, MD 01/26/13 [phone]

## 2013-01-26 NOTE — ED Notes (Signed)
Bed: WA17 Expected date:  Expected time:  Means of arrival:  Comments: EMS-allergic reaction/hives

## 2013-01-26 NOTE — ED Notes (Addendum)
Per EMS: pt woke up about 0500 this morning with swollen lip, has hives on extremities. Clear lung sounds no respiratory issues. Pt is [redacted] weeks pregnant. 50 mg benadryl PO @ 0825. Pt states she used new detergent.

## 2013-03-16 NOTE — L&D Delivery Note (Signed)
Delivery Note   Called to bedside for delivery, however patient had already delivered at 2:39 PM a viable and healthy female was delivered via Vaginal, Spontaneous Delivery (Presentation: Left Occiput Anterior).  APGAR: 8, 8; weight 5 lb 5.7 oz (2430 g).   Placenta status: Intact, Spontaneous.  Cord: 3 vessels with the following complications: None. Anesthesia: None  Episiotomy: None Lacerations: None Suture Repair: n/a Est. Blood Loss (mL): 300  Mom to postpartum.  Baby to Nursery.  Aideen Fenster 07/10/2013, 9:20 PM

## 2013-04-23 ENCOUNTER — Encounter (HOSPITAL_COMMUNITY): Payer: Self-pay | Admitting: *Deleted

## 2013-04-23 ENCOUNTER — Inpatient Hospital Stay (HOSPITAL_COMMUNITY)
Admission: AD | Admit: 2013-04-23 | Discharge: 2013-04-23 | Disposition: A | Discharge status: Home / Self Care | Payer: Medicaid Other | Source: Ambulatory Visit | Attending: Obstetrics and Gynecology | Admitting: Obstetrics and Gynecology

## 2013-04-23 DIAGNOSIS — Z87891 Personal history of nicotine dependence: Secondary | ICD-10-CM | POA: Insufficient documentation

## 2013-04-23 DIAGNOSIS — R197 Diarrhea, unspecified: Secondary | ICD-10-CM | POA: Insufficient documentation

## 2013-04-23 DIAGNOSIS — O99891 Other specified diseases and conditions complicating pregnancy: Secondary | ICD-10-CM | POA: Insufficient documentation

## 2013-04-23 DIAGNOSIS — A084 Viral intestinal infection, unspecified: Secondary | ICD-10-CM

## 2013-04-23 DIAGNOSIS — O9989 Other specified diseases and conditions complicating pregnancy, childbirth and the puerperium: Principal | ICD-10-CM

## 2013-04-23 DIAGNOSIS — A088 Other specified intestinal infections: Secondary | ICD-10-CM

## 2013-04-23 DIAGNOSIS — K5289 Other specified noninfective gastroenteritis and colitis: Secondary | ICD-10-CM | POA: Insufficient documentation

## 2013-04-23 DIAGNOSIS — O212 Late vomiting of pregnancy: Secondary | ICD-10-CM | POA: Insufficient documentation

## 2013-04-23 DIAGNOSIS — R109 Unspecified abdominal pain: Secondary | ICD-10-CM | POA: Insufficient documentation

## 2013-04-23 LAB — COMPREHENSIVE METABOLIC PANEL
ALBUMIN: 2.6 g/dL — AB (ref 3.5–5.2)
ALT: 6 U/L (ref 0–35)
AST: 11 U/L (ref 0–37)
Alkaline Phosphatase: 70 U/L (ref 39–117)
BUN: 5 mg/dL — ABNORMAL LOW (ref 6–23)
CO2: 21 meq/L (ref 19–32)
CREATININE: 0.6 mg/dL (ref 0.50–1.10)
Calcium: 8.6 mg/dL (ref 8.4–10.5)
Chloride: 104 mEq/L (ref 96–112)
GFR calc Af Amer: >90 mL/min (ref >90)
Glucose, Bld: 87 mg/dL (ref 70–99)
Potassium: 3.7 mEq/L (ref 3.7–5.3)
Sodium: 137 mEq/L (ref 137–147)
Total Bilirubin: <0.2 mg/dL — ABNORMAL LOW (ref 0.3–1.2)
Total Protein: 6.1 g/dL (ref 6.0–8.3)

## 2013-04-23 LAB — URINALYSIS, ROUTINE W REFLEX MICROSCOPIC
Bilirubin Urine: NEGATIVE
Glucose, UA: NEGATIVE mg/dL
Hgb urine dipstick: NEGATIVE
KETONES UR: NEGATIVE mg/dL
Leukocytes, UA: NEGATIVE
NITRITE: NEGATIVE
Protein, ur: NEGATIVE mg/dL
Specific Gravity, Urine: 1.02 (ref 1.005–1.030)
UROBILINOGEN UA: 0.2 mg/dL (ref 0.0–1.0)
pH: 7 (ref 5.0–8.0)

## 2013-04-23 LAB — CBC
HCT: 27.5 % — ABNORMAL LOW (ref 36.0–46.0)
Hemoglobin: 9.4 g/dL — ABNORMAL LOW (ref 12.0–15.0)
MCH: 30.7 pg (ref 26.0–34.0)
MCHC: 34.2 g/dL (ref 30.0–36.0)
MCV: 89.9 fL (ref 78.0–100.0)
PLATELETS: 259 10*3/uL (ref 150–400)
RBC: 3.06 MIL/uL — ABNORMAL LOW (ref 3.87–5.11)
RDW: 13 % (ref 11.5–15.5)
WBC: 7.6 10*3/uL (ref 4.0–10.5)

## 2013-04-23 MED ORDER — LACTATED RINGERS IV BOLUS (SEPSIS)
1000.0000 mL | Freq: Once | INTRAVENOUS | Status: DC
Start: 2013-04-23 — End: 2013-04-23

## 2013-04-23 NOTE — Discharge Instructions (Signed)
Diet for Diarrhea, Adult  Frequent, runny stools (diarrhea) may be caused or worsened by food or drink. Diarrhea may be relieved by changing your diet. Since diarrhea can last up to 7 days, it is easy for you to lose too much fluid from the body and become dehydrated. Fluids that are lost need to be replaced. Along with a modified diet, make sure you drink enough fluids to keep your urine clear or pale yellow.  DIET INSTRUCTIONS  · Ensure adequate fluid intake (hydration): have 1 cup (8 oz) of fluid for each diarrhea episode. Avoid fluids that contain simple sugars or sports drinks, fruit juices, whole milk products, and sodas. Your urine should be clear or pale yellow if you are drinking enough fluids. Hydrate with an oral rehydration solution that you can purchase at pharmacies, retail stores, and online. You can prepare an oral rehydration solution at home by mixing the following ingredients together:  ·   tsp table salt.  · ¾ tsp baking soda.  ·  tsp salt substitute containing potassium chloride.  · 1  tablespoons sugar.  · 1 L (34 oz) of water.  · Certain foods and beverages may increase the speed at which food moves through the gastrointestinal (GI) tract. These foods and beverages should be avoided and include:  · Caffeinated and alcoholic beverages.  · High-fiber foods, such as raw fruits and vegetables, nuts, seeds, and whole grain breads and cereals.  · Foods and beverages sweetened with sugar alcohols, such as xylitol, sorbitol, and mannitol.  · Some foods may be well tolerated and may help thicken stool including:  · Starchy foods, such as rice, toast, pasta, low-sugar cereal, oatmeal, grits, baked potatoes, crackers, and bagels.    · Bananas.    · Applesauce.  · Add probiotic-rich foods to help increase healthy bacteria in the GI tract, such as yogurt and fermented milk products.  RECOMMENDED FOODS AND BEVERAGES  Starches  Choose foods with less than 2 g of fiber per serving.  · Recommended:  White,  French, and pita breads, plain rolls, buns, bagels. Plain muffins, matzo. Soda, saltine, or graham crackers. Pretzels, melba toast, zwieback. Cooked cereals made with water: cornmeal, farina, cream cereals. Dry cereals: refined corn, wheat, rice. Potatoes prepared any way without skins, refined macaroni, spaghetti, noodles, refined rice.  · Avoid:  Bread, rolls, or crackers made with whole wheat, multi-grains, rye, bran seeds, nuts, or coconut. Corn tortillas or taco shells. Cereals containing whole grains, multi-grains, bran, coconut, nuts, raisins. Cooked or dry oatmeal. Coarse wheat cereals, granola. Cereals advertised as "high-fiber." Potato skins. Whole grain pasta, wild or brown rice. Popcorn. Sweet potatoes, yams. Sweet rolls, doughnuts, waffles, pancakes, sweet breads.  Vegetables  · Recommended: Strained tomato and vegetable juices. Most well-cooked and canned vegetables without seeds. Fresh: Tender lettuce, cucumber without the skin, cabbage, spinach, bean sprouts.  · Avoid: Fresh, cooked, or canned: Artichokes, baked beans, beet greens, broccoli, Brussels sprouts, corn, kale, legumes, peas, sweet potatoes. Cooked: Green or red cabbage, spinach. Avoid large servings of any vegetables because vegetables shrink when cooked, and they contain more fiber per serving than fresh vegetables.  Fruit  · Recommended: Cooked or canned: Apricots, applesauce, cantaloupe, cherries, fruit cocktail, grapefruit, grapes, kiwi, mandarin oranges, peaches, pears, plums, watermelon. Fresh: Apples without skin, ripe banana, grapes, cantaloupe, cherries, grapefruit, peaches, oranges, plums. Keep servings limited to ½ cup or 1 piece.  · Avoid: Fresh: Apples with skin, apricots, mangoes, pears, raspberries, strawberries. Prune juice, stewed or dried prunes. Dried   fruits, raisins, dates. Large servings of all fresh fruits.  Protein  · Recommended: Ground or well-cooked tender beef, ham, veal, lamb, pork, or poultry. Eggs. Fish,  oysters, shrimp, lobster, other seafoods. Liver, organ meats.  · Avoid: Tough, fibrous meats with gristle. Peanut butter, smooth or chunky. Cheese, nuts, seeds, legumes, dried peas, beans, lentils.  Dairy  · Recommended: Yogurt, lactose-free milk, kefir, drinkable yogurt, buttermilk, soy milk, or plain hard cheese.  · Avoid: Milk, chocolate milk, beverages made with milk, such as milkshakes.  Soups  · Recommended: Bouillon, broth, or soups made from allowed foods. Any strained soup.  · Avoid: Soups made from vegetables that are not allowed, cream or milk-based soups.  Desserts and Sweets  · Recommended: Sugar-free gelatin, sugar-free frozen ice pops made without sugar alcohol.  · Avoid: Plain cakes and cookies, pie made with fruit, pudding, custard, cream pie. Gelatin, fruit, ice, sherbet, frozen ice pops. Ice cream, ice milk without nuts. Plain hard candy, honey, jelly, molasses, syrup, sugar, chocolate syrup, gumdrops, marshmallows.  Fats and Oils  · Recommended: Limit fats to less than 8 tsp per day.  · Avoid: Seeds, nuts, olives, avocados. Margarine, butter, cream, mayonnaise, salad oils, plain salad dressings. Plain gravy, crisp bacon without rind.  Beverages  · Recommended: Water, decaffeinated teas, oral rehydration solutions, sugar-free beverages not sweetened with sugar alcohols.  · Avoid: Fruit juices, caffeinated beverages (coffee, tea, soda), alcohol, sports drinks, or lemon-lime soda.  Condiments  · Recommended: Ketchup, mustard, horseradish, vinegar, cocoa powder. Spices in moderation: allspice, basil, bay leaves, celery powder or leaves, cinnamon, cumin powder, curry powder, ginger, mace, marjoram, onion or garlic powder, oregano, paprika, parsley flakes, ground pepper, rosemary, sage, savory, tarragon, thyme, turmeric.  · Avoid: Coconut, honey.  Document Released: 05/23/2003 Document Revised: 11/25/2011 Document Reviewed: 07/17/2011  [employer]® Patient Information ©2014 [employer].

## 2013-04-23 NOTE — MAU Provider Note (Signed)
agree

## 2013-04-23 NOTE — MAU Provider Note (Signed)
History     CSN: [id number]  Arrival date and time: 04/23/13 1111   First Provider Initiated Contact with Patient 04/23/13 1200      Chief Complaint  Patient presents with  . Emesis  . Diarrhea   HPI Ms. [name] is a 28 y.o. [phone] at [redacted]w[redacted]d who presents to MAU today with complaint of N/V/D since last night. The patient states that symptoms started after eating chinese food. She denies fever. She is having occasional lower abdominal cramping. She denies contractions, vaginal bleeding, discharge or LOF.   OB History   Grav Para Term Preterm Abortions TAB SAB Ect Mult Living   4 2 2  1 1    2       Past Medical History  Diagnosis Date  . Anemia     Past Surgical History  Procedure Laterality Date  . Therapeutic abortion    . No past surgeries      Family History  Problem Relation Age of Onset  . Stroke Father   . Diabetes Maternal Grandmother   . Hypertension Maternal Grandmother   . Alcohol abuse Maternal Grandfather   . Kidney disease Maternal Grandfather   . Diabetes Paternal Grandmother     History  Substance Use Topics  . Smoking status: Former Smoker    Quit date: 11/10/2012  . Smokeless tobacco: Never Used  . Alcohol Use: 0.5 oz/week    1 drink(s) per week     Comment: smokes 2-3 joints a day, last drink was in May 2014    Allergies:  Allergies  Allergen Reactions  . Latex Rash    No prescriptions prior to admission    Review of Systems  Constitutional: Negative for fever.  Gastrointestinal: Positive for nausea, vomiting, abdominal pain and diarrhea. Negative for constipation.  Genitourinary:       Neg - vaginal bleeding, discharge   Physical Exam   Blood pressure 108/61, pulse 89, temperature 98.8 F (37.1 C), temperature source Oral, resp. rate 18, height 5' 2.5" (1.588 m), weight 130 lb (58.968 kg), last menstrual period 10/16/2012.  Physical Exam  Constitutional: She is oriented to person, place, and time. She appears  well-developed and well-nourished. No distress.  HENT:  Head: Normocephalic and atraumatic.  Cardiovascular: Normal rate.   Respiratory: Effort normal.  GI: Soft. Bowel sounds are normal. She exhibits no distension and no mass. There is no tenderness. There is no rebound and no guarding.  Neurological: She is alert and oriented to person, place, and time.  Skin: Skin is warm and dry. No erythema.  Psychiatric: She has a normal mood and affect.   Results for orders placed during the hospital encounter of 04/23/13 (from the past 24 hour(s))  URINALYSIS, ROUTINE W REFLEX MICROSCOPIC     Status: None   Collection Time    04/23/13 11:35 AM      Result Value Range   Color, Urine YELLOW  YELLOW   APPearance CLEAR  CLEAR   Specific Gravity, Urine 1.020  1.005 - 1.030   pH 7.0  5.0 - 8.0   Glucose, UA NEGATIVE  NEGATIVE mg/dL   Hgb urine dipstick NEGATIVE  NEGATIVE   Bilirubin Urine NEGATIVE  NEGATIVE   Ketones, ur NEGATIVE  NEGATIVE mg/dL   Protein, ur NEGATIVE  NEGATIVE mg/dL   Urobilinogen, UA 0.2  0.0 - 1.0 mg/dL   Nitrite NEGATIVE  NEGATIVE   Leukocytes, UA NEGATIVE  NEGATIVE  CBC     Status: Abnormal  Collection Time    04/23/13 12:10 PM      Result Value Range   WBC 7.6  4.0 - 10.5 K/uL   RBC 3.06 (*) 3.87 - 5.11 MIL/uL   Hemoglobin 9.4 (*) 12.0 - 15.0 g/dL   HCT 40.927.5 (*) 81.136.0 - 91.446.0 %   MCV 89.9  78.0 - 100.0 fL   MCH 30.7  26.0 - 34.0 pg   MCHC 34.2  30.0 - 36.0 g/dL   RDW 78.213.0  95.611.5 - 21.315.5 %   Platelets 259  150 - 400 K/uL  COMPREHENSIVE METABOLIC PANEL     Status: Abnormal   Collection Time    04/23/13 12:10 PM      Result Value Range   Sodium 137  137 - 147 mEq/L   Potassium 3.7  3.7 - 5.3 mEq/L   Chloride 104  96 - 112 mEq/L   CO2 21  19 - 32 mEq/L   Glucose, Bld 87  70 - 99 mg/dL   BUN 5 (*) 6 - 23 mg/dL   Creatinine, Ser 0.860.60  0.50 - 1.10 mg/dL   Calcium 8.6  8.4 - 57.810.5 mg/dL   Total Protein 6.1  6.0 - 8.3 g/dL   Albumin 2.6 (*) 3.5 - 5.2 g/dL   AST 11  0  - 37 U/L   ALT 6  0 - 35 U/L   Alkaline Phosphatase 70  39 - 117 U/L   Total Bilirubin <0.2 (*) 0.3 - 1.2 mg/dL   GFR calc non Af Amer >90  >90 mL/min   GFR calc Af Amer >90  >90 mL/min    Fetal Monitoring: Baseline: 125 bpm, moderate variability, + accelerations, no decelerations Contractions: none  MAU Course  Procedures None  MDM Discussed patient with Dr. Claiborne Billingsallahan. CBC, CMP, UA today No signs of dehydration Discussed results with Dr. Claiborne Billingsallahan. Patient is tolerated PO in MAU now. Ok for discharge. Follow-up in the office as scheduled  Assessment and Plan  A: Viral gastroenteritis  P: Discharge home Recommended patient increase PO hydration as tolerated Patient may advance diet as tolerated Patient [facility] Patient may return to MAU as needed or if her condition were to change or worsen  Freddi StarrJulie N Ethier, PA-C  04/23/2013, 1:24 PM

## 2013-04-23 NOTE — MAU Note (Signed)
Pt presents with complaints of nausea, vomiting and diarrhea since last night after she ate. Denies any vaginal bleeding or cramping.

## 2013-06-14 LAB — OB RESULTS CONSOLE GBS: GBS: NEGATIVE

## 2013-06-22 ENCOUNTER — Other Ambulatory Visit: Payer: Self-pay

## 2013-06-26 ENCOUNTER — Other Ambulatory Visit (HOSPITAL_COMMUNITY): Payer: Self-pay | Admitting: Obstetrics & Gynecology

## 2013-06-26 ENCOUNTER — Ambulatory Visit (HOSPITAL_COMMUNITY)
Admission: RE | Admit: 2013-06-26 | Discharge: 2013-06-26 | Disposition: A | Discharge status: Home / Self Care | Payer: Medicaid Other | Source: Ambulatory Visit

## 2013-06-26 ENCOUNTER — Ambulatory Visit (HOSPITAL_COMMUNITY)
Admission: RE | Admit: 2013-06-26 | Discharge: 2013-06-26 | Disposition: A | Discharge status: Home / Self Care | Payer: Medicaid Other | Source: Ambulatory Visit | Attending: Obstetrics & Gynecology | Admitting: Obstetrics & Gynecology

## 2013-06-26 ENCOUNTER — Encounter (HOSPITAL_COMMUNITY): Payer: Self-pay

## 2013-06-26 DIAGNOSIS — IMO0002 Reserved for concepts with insufficient information to code with codable children: Secondary | ICD-10-CM

## 2013-06-26 DIAGNOSIS — Z3689 Encounter for other specified antenatal screening: Secondary | ICD-10-CM

## 2013-06-26 DIAGNOSIS — O365990 Maternal care for other known or suspected poor fetal growth, unspecified trimester, not applicable or unspecified: Secondary | ICD-10-CM | POA: Insufficient documentation

## 2013-06-26 DIAGNOSIS — Z1389 Encounter for screening for other disorder: Secondary | ICD-10-CM | POA: Insufficient documentation

## 2013-06-26 DIAGNOSIS — O358XX0 Maternal care for other (suspected) fetal abnormality and damage, not applicable or unspecified: Secondary | ICD-10-CM | POA: Insufficient documentation

## 2013-06-26 DIAGNOSIS — Z363 Encounter for antenatal screening for malformations: Secondary | ICD-10-CM | POA: Insufficient documentation

## 2013-06-26 DIAGNOSIS — O99330 Smoking (tobacco) complicating pregnancy, unspecified trimester: Secondary | ICD-10-CM | POA: Insufficient documentation

## 2013-06-26 DIAGNOSIS — O99340 Other mental disorders complicating pregnancy, unspecified trimester: Secondary | ICD-10-CM

## 2013-06-26 NOTE — Consult Note (Addendum)
MFM consult  28 yr old [id number] at 804w6d by 13 week ultrasound with fetal growth restriction seen on outside ultrasound referred by Essie HartWalda Pinn for fetal ultrasound and consult.  Past OB hx: 2 full term vaginal deliveries; infants were 6#5oz and 6#6oz; TAB  No other significant history  Ultrasound today shows: Single intrauterine pregnancy. Estimated fetal weight is in the <10th%. Anterior placenta without evidence of previa. Normal amniotic fluid index. The anatomy survey is extremely limited by advanced gestational age and fetal position. No abnormalities are seen. Normal umbilical artery Doppler studies. Normal biophysical profile of 8/8.  I counseled the patient as follows: 1. Fetal growth restriction: - discussed etiology is unclear: may be placental insufficiency (although reassuring that fluid and Doppler studies are normal), constitutional, or other more rare etiologies (infection, aneuploidy, genetic) - discussed increased risk of stillbirth with fetal growth restriction - recommend antenatal testing with twice weekly nonstress tests and weekly AFI and Doppler studies - if remains uncomplicated (normal AFI, Doppler studies, and reassuring testing) recommend delivery at 38-39 weeks - recommend fetal kick counts 2. Limited anatomy survey: - explained to patient the limitations of ultrasound in detecting fetal anomalies especially at this advanced gestational age 593. Patient did not have aneuploidy screening  I spent a total of 45 minutes with the patient of which >50% was spent in face to face consultation.  This information was relayed to the office on 06/26/13.  Eulis FosterKristen Dior Dominik, MD

## 2013-06-26 NOTE — Progress Notes (Signed)
[facility] ultrasound  Indication: 28 yr old [id number] at 5585w6d by 13 week ultrasound with fetal growth restriction seen on outside ultrasound.  Findings: 1. Single intrauterine pregnancy. 2. Estimated fetal weight is in the <10th%. 3. Anterior placenta without evidence of previa. 4. Normal amniotic fluid index. 5. The anatomy survey is extremely limited by advanced gestational age and fetal position. No abnormalities are seen. 6. Normal umbilical artery Doppler studies. 7. Normal biophysical profile of 8/8.  Recommendations: 1. Fetal growth restriction: - discussed etiology is unclear: may be placental insufficiency (although reassuring that fluid and Doppler studies are normal), constitutional, or other more rare etiologies (infection, aneuploidy, genetic) - discussed increased risk of stillbirth with fetal growth restriction - recommend antenatal testing with twice weekly nonstress tests and weekly AFI and Doppler studies - if remains uncomplicated (normal AFI, Doppler studies, and reassuring testing) recommend delivery at 38-39 weeks - recommend fetal kick counts 2. Limited anatomy survey: - explained to patient the limitations of ultrasound in detecting fetal anomalies especially at this advanced gestational age 55. Patient did not have aneuploidy screening  Tanya FosterKristen Janaysia Mcleroy, MD

## 2013-06-29 ENCOUNTER — Other Ambulatory Visit (HOSPITAL_COMMUNITY): Payer: Medicaid Other

## 2013-07-03 ENCOUNTER — Ambulatory Visit (HOSPITAL_COMMUNITY)
Admission: RE | Admit: 2013-07-03 | Discharge: 2013-07-03 | Disposition: A | Discharge status: Home / Self Care | Payer: Medicaid Other | Source: Ambulatory Visit | Attending: Obstetrics & Gynecology | Admitting: Obstetrics & Gynecology

## 2013-07-03 ENCOUNTER — Other Ambulatory Visit (HOSPITAL_COMMUNITY): Payer: Self-pay | Admitting: Obstetrics & Gynecology

## 2013-07-03 DIAGNOSIS — O365990 Maternal care for other known or suspected poor fetal growth, unspecified trimester, not applicable or unspecified: Secondary | ICD-10-CM

## 2013-07-03 DIAGNOSIS — O99340 Other mental disorders complicating pregnancy, unspecified trimester: Secondary | ICD-10-CM

## 2013-07-03 DIAGNOSIS — O358XX0 Maternal care for other (suspected) fetal abnormality and damage, not applicable or unspecified: Secondary | ICD-10-CM | POA: Insufficient documentation

## 2013-07-03 DIAGNOSIS — O99330 Smoking (tobacco) complicating pregnancy, unspecified trimester: Secondary | ICD-10-CM | POA: Insufficient documentation

## 2013-07-06 ENCOUNTER — Telehealth (HOSPITAL_COMMUNITY): Payer: Self-pay | Admitting: *Deleted

## 2013-07-06 ENCOUNTER — Encounter (HOSPITAL_COMMUNITY): Payer: Self-pay | Admitting: *Deleted

## 2013-07-06 NOTE — Telephone Encounter (Signed)
Preadmission screen  

## 2013-07-10 ENCOUNTER — Encounter (HOSPITAL_COMMUNITY): Payer: Self-pay

## 2013-07-10 ENCOUNTER — Inpatient Hospital Stay (HOSPITAL_COMMUNITY)
Admission: RE | Admit: 2013-07-10 | Discharge: 2013-07-12 | DRG: 775 | Disposition: A | Discharge status: Home / Self Care | Payer: Medicaid Other | Source: Ambulatory Visit | Attending: Obstetrics & Gynecology | Admitting: Obstetrics & Gynecology

## 2013-07-10 DIAGNOSIS — Z823 Family history of stroke: Secondary | ICD-10-CM

## 2013-07-10 DIAGNOSIS — Z87891 Personal history of nicotine dependence: Secondary | ICD-10-CM

## 2013-07-10 DIAGNOSIS — IMO0002 Reserved for concepts with insufficient information to code with codable children: Secondary | ICD-10-CM | POA: Diagnosis present

## 2013-07-10 DIAGNOSIS — Z833 Family history of diabetes mellitus: Secondary | ICD-10-CM

## 2013-07-10 DIAGNOSIS — O365990 Maternal care for other known or suspected poor fetal growth, unspecified trimester, not applicable or unspecified: Principal | ICD-10-CM | POA: Diagnosis present

## 2013-07-10 LAB — CBC
HEMATOCRIT: 33.4 % — AB (ref 36.0–46.0)
Hemoglobin: 11.2 g/dL — ABNORMAL LOW (ref 12.0–15.0)
MCH: 30.8 pg (ref 26.0–34.0)
MCHC: 33.5 g/dL (ref 30.0–36.0)
MCV: 91.8 fL (ref 78.0–100.0)
PLATELETS: 282 10*3/uL (ref 150–400)
RBC: 3.64 MIL/uL — ABNORMAL LOW (ref 3.87–5.11)
RDW: 13.4 % (ref 11.5–15.5)
WBC: 8.6 10*3/uL (ref 4.0–10.5)

## 2013-07-10 LAB — RPR

## 2013-07-10 LAB — ABO/RH: ABO/RH(D): O POS

## 2013-07-10 MED ORDER — DIBUCAINE 1 % RE OINT
1.0000 "application " | TOPICAL_OINTMENT | RECTAL | Status: DC | PRN
  Administered 2013-07-11: 1 via RECTAL
  Filled 2013-07-10: qty 28

## 2013-07-10 MED ORDER — FENTANYL 2.5 MCG/ML BUPIVACAINE 1/10 % EPIDURAL INFUSION (WH - ANES): 14.0000 mL/h | INTRAMUSCULAR | Status: DC | PRN

## 2013-07-10 MED ORDER — IBUPROFEN 600 MG PO TABS
600.0000 mg | ORAL_TABLET | Freq: Four times a day (QID) | ORAL | Status: DC
  Administered 2013-07-10 – 2013-07-12 (×7): 600 mg via ORAL
  Filled 2013-07-10 (×7): qty 1

## 2013-07-10 MED ORDER — SODIUM CHLORIDE 0.9 % IJ SOLN: 3.0000 mL | Freq: Two times a day (BID) | INTRAMUSCULAR | Status: DC

## 2013-07-10 MED ORDER — ONDANSETRON HCL 4 MG PO TABS: 4.0000 mg | ORAL_TABLET | ORAL | Status: DC | PRN

## 2013-07-10 MED ORDER — LIDOCAINE HCL (PF) 1 % IJ SOLN
30.0000 mL | INTRAMUSCULAR | Status: DC | PRN
  Filled 2013-07-10: qty 30

## 2013-07-10 MED ORDER — EPHEDRINE 5 MG/ML INJ
10.0000 mg | INTRAVENOUS | Status: DC | PRN
  Filled 2013-07-10: qty 2

## 2013-07-10 MED ORDER — OXYCODONE-ACETAMINOPHEN 5-325 MG PO TABS
1.0000 | ORAL_TABLET | ORAL | Status: DC | PRN
  Administered 2013-07-10: 1 via ORAL
  Filled 2013-07-10: qty 1

## 2013-07-10 MED ORDER — PHENYLEPHRINE 40 MCG/ML (10ML) SYRINGE FOR IV PUSH (FOR BLOOD PRESSURE SUPPORT)
80.0000 ug | PREFILLED_SYRINGE | INTRAVENOUS | Status: DC | PRN
  Filled 2013-07-10: qty 2

## 2013-07-10 MED ORDER — CITRIC ACID-SODIUM CITRATE 334-500 MG/5ML PO SOLN: 30.0000 mL | ORAL | Status: DC | PRN

## 2013-07-10 MED ORDER — PRENATAL MULTIVITAMIN CH
1.0000 | ORAL_TABLET | Freq: Every day | ORAL | Status: DC
  Administered 2013-07-11: 1 via ORAL
  Filled 2013-07-10 (×2): qty 1

## 2013-07-10 MED ORDER — SENNOSIDES-DOCUSATE SODIUM 8.6-50 MG PO TABS
2.0000 | ORAL_TABLET | ORAL | Status: DC
  Administered 2013-07-10 – 2013-07-11 (×2): 2 via ORAL
  Filled 2013-07-10 (×2): qty 2

## 2013-07-10 MED ORDER — FLEET ENEMA 7-19 GM/118ML RE ENEM: 1.0000 | ENEMA | RECTAL | Status: DC | PRN

## 2013-07-10 MED ORDER — BENZOCAINE-MENTHOL 20-0.5 % EX AERO: 1.0000 "application " | INHALATION_SPRAY | CUTANEOUS | Status: DC | PRN

## 2013-07-10 MED ORDER — TERBUTALINE SULFATE 1 MG/ML IJ SOLN: 0.2500 mg | Freq: Once | INTRAMUSCULAR | Status: AC | PRN

## 2013-07-10 MED ORDER — WITCH HAZEL-GLYCERIN EX PADS
1.0000 "application " | MEDICATED_PAD | CUTANEOUS | Status: DC | PRN
  Administered 2013-07-11: 1 via TOPICAL

## 2013-07-10 MED ORDER — IBUPROFEN 600 MG PO TABS
600.0000 mg | ORAL_TABLET | Freq: Four times a day (QID) | ORAL | Status: DC | PRN
  Administered 2013-07-10: 600 mg via ORAL
  Filled 2013-07-10: qty 1

## 2013-07-10 MED ORDER — LACTATED RINGERS IV SOLN
INTRAVENOUS | Status: DC
  Administered 2013-07-10 (×2): via INTRAVENOUS

## 2013-07-10 MED ORDER — OXYCODONE-ACETAMINOPHEN 5-325 MG PO TABS
1.0000 | ORAL_TABLET | ORAL | Status: DC | PRN
  Administered 2013-07-11 – 2013-07-12 (×4): 1 via ORAL
  Filled 2013-07-10 (×4): qty 1

## 2013-07-10 MED ORDER — ONDANSETRON HCL 4 MG/2ML IJ SOLN
4.0000 mg | Freq: Four times a day (QID) | INTRAMUSCULAR | Status: DC | PRN
  Administered 2013-07-10: 4 mg via INTRAVENOUS
  Filled 2013-07-10: qty 2

## 2013-07-10 MED ORDER — OXYTOCIN BOLUS FROM INFUSION
500.0000 mL | INTRAVENOUS | Status: DC
  Administered 2013-07-10: 500 mL via INTRAVENOUS

## 2013-07-10 MED ORDER — PHENYLEPHRINE 40 MCG/ML (10ML) SYRINGE FOR IV PUSH (FOR BLOOD PRESSURE SUPPORT)
80.0000 ug | PREFILLED_SYRINGE | INTRAVENOUS | Status: DC | PRN
Start: 2013-07-10 — End: 2013-07-12
  Filled 2013-07-10: qty 2

## 2013-07-10 MED ORDER — LANOLIN HYDROUS EX OINT: TOPICAL_OINTMENT | CUTANEOUS | Status: DC | PRN

## 2013-07-10 MED ORDER — SIMETHICONE 80 MG PO CHEW: 80.0000 mg | CHEWABLE_TABLET | ORAL | Status: DC | PRN

## 2013-07-10 MED ORDER — ZOLPIDEM TARTRATE 5 MG PO TABS: 5.0000 mg | ORAL_TABLET | Freq: Every evening | ORAL | Status: DC | PRN

## 2013-07-10 MED ORDER — SODIUM CHLORIDE 0.9 % IV SOLN: 250.0000 mL | INTRAVENOUS | Status: DC | PRN

## 2013-07-10 MED ORDER — LACTATED RINGERS IV SOLN: 500.0000 mL | INTRAVENOUS | Status: DC | PRN

## 2013-07-10 MED ORDER — DIPHENHYDRAMINE HCL 50 MG/ML IJ SOLN: 12.5000 mg | INTRAMUSCULAR | Status: DC | PRN

## 2013-07-10 MED ORDER — ONDANSETRON HCL 4 MG/2ML IJ SOLN: 4.0000 mg | INTRAMUSCULAR | Status: DC | PRN

## 2013-07-10 MED ORDER — OXYTOCIN 40 UNITS IN LACTATED RINGERS INFUSION - SIMPLE MED
1.0000 m[IU]/min | INTRAVENOUS | Status: DC
Start: 2013-07-10 — End: 2013-07-12
  Administered 2013-07-10: 2 m[IU]/min via INTRAVENOUS
  Filled 2013-07-10: qty 1000

## 2013-07-10 MED ORDER — OXYTOCIN 40 UNITS IN LACTATED RINGERS INFUSION - SIMPLE MED: 62.5000 mL/h | INTRAVENOUS | Status: DC

## 2013-07-10 MED ORDER — DIPHENHYDRAMINE HCL 25 MG PO CAPS: 25.0000 mg | ORAL_CAPSULE | Freq: Four times a day (QID) | ORAL | Status: DC | PRN

## 2013-07-10 MED ORDER — ACETAMINOPHEN 325 MG PO TABS: 650.0000 mg | ORAL_TABLET | ORAL | Status: DC | PRN

## 2013-07-10 MED ORDER — LACTATED RINGERS IV SOLN: 500.0000 mL | Freq: Once | INTRAVENOUS | Status: DC

## 2013-07-10 MED ORDER — TETANUS-DIPHTH-ACELL PERTUSSIS 5-2.5-18.5 LF-MCG/0.5 IM SUSP
0.5000 mL | Freq: Once | INTRAMUSCULAR | Status: AC
  Administered 2013-07-10: 0.5 mL via INTRAMUSCULAR
  Filled 2013-07-10: qty 0.5

## 2013-07-10 MED ORDER — SODIUM CHLORIDE 0.9 % IJ SOLN: 3.0000 mL | INTRAMUSCULAR | Status: DC | PRN

## 2013-07-10 MED ORDER — FENTANYL CITRATE 0.05 MG/ML IJ SOLN
50.0000 ug | INTRAMUSCULAR | Status: DC | PRN
  Administered 2013-07-10: 50 ug via INTRAVENOUS
  Filled 2013-07-10: qty 2

## 2013-07-10 NOTE — H&P (Signed)
[name] is a 28 y.o. female presenting for Induction of labor.  Maternal Medical History:  Reason for admission: 28 yo G4P2012 at 39 weeks presents for Induction of labor for IUGR. Patient w/o complaints  Contractions: Onset was 1-2 hours ago.   Frequency: irregular.   Perceived severity is mild.    Fetal activity: Perceived fetal activity is normal.   Last perceived fetal movement was within the past 12 hours.    Prenatal complications: IUGR.     OB History   Grav Para Term Preterm Abortions TAB SAB Ect Mult Living   4 2 2  1 1    2      Past Medical History  Diagnosis Date  . Anemia    Past Surgical History  Procedure Laterality Date  . Therapeutic abortion    . No past surgeries     Family History: family history includes Alcohol abuse in her maternal grandfather; Diabetes in her maternal grandmother and paternal grandmother; Hypertension in her maternal grandmother; Kidney disease in her maternal grandfather; Stroke in her father. Social History:  reports that she quit smoking about 7 months ago. She has never used smokeless tobacco. She reports that she drinks about .5 ounces of alcohol per week. She reports that she uses illicit drugs (Marijuana).   Prenatal Transfer Tool  Maternal Diabetes: No Genetic Screening: Normal Maternal Ultrasounds/Referrals: Abnormal:  Findings:   IUGR Fetal Ultrasounds or other Referrals:  Referred to Materal Fetal Medicine  Maternal Substance Abuse:  No Significant Maternal Medications:  Meds include: Other: PNV Significant Maternal Lab Results:  Lab values include: Group B Strep negative Other Comments:  none  Review of Systems  Constitutional: Negative for fever and chills.  Eyes: Negative for blurred vision.  Respiratory: Negative for cough.   Gastrointestinal: Negative for heartburn.  Musculoskeletal: Negative for myalgias.  Neurological: Negative for dizziness, tingling and headaches.  Endo/Heme/Allergies: Does not  bruise/bleed easily.  All other systems reviewed and are negative.   Dilation: 2 Effacement (%): 50 Station: -3 Exam by:: Tanya EngK. Haynes, RN Blood pressure 115/68, pulse 64, temperature 98.3 F (36.8 C), temperature source Oral, resp. rate 16, height 5\' 3"  (1.6 m), weight 63.504 kg (140 lb), last menstrual period 10/16/2012. Maternal Exam:  Uterine Assessment: Contraction strength is mild.  Contraction frequency is irregular.   Abdomen: Patient reports no abdominal tenderness. Fundal height is 36 cm.   Estimated fetal weight is 2800 grams.   Fetal presentation: vertex  Introitus: Normal vulva. Normal vagina.  Ferning test: not done.  Nitrazine test: not done.  Pelvis: adequate for delivery.   Cervix: Cervix evaluated by digital exam.   2/50/-2  Physical Exam  Nursing note and vitals reviewed. Constitutional: She is oriented to person, place, and time. She appears well-developed and well-nourished.  Cardiovascular: Normal rate.   Respiratory: Effort normal and breath sounds normal.  GI: Soft.  Genitourinary: Vagina normal.  Musculoskeletal: Normal range of motion.  Neurological: She is alert and oriented to person, place, and time.    Prenatal labs: ABO, Rh: O/Positive/-- (10/29 0000) Antibody: Negative (10/29 0000) Rubella: Immune (10/29 0000) RPR: Nonreactive (10/29 0000)  HBsAg: Negative (10/29 0000)  HIV: Non-reactive (10/29 0000)  GBS: Negative (04/01 0000)   Assessment/Plan: SIUP at term for IOL for IUGR Favorable cervix, will start Induction with Pitocin Continuous monitoring.     [name] 07/10/2013, 9:42 AM

## 2013-07-11 LAB — CBC
HCT: 28.7 % — ABNORMAL LOW (ref 36.0–46.0)
Hemoglobin: 9.7 g/dL — ABNORMAL LOW (ref 12.0–15.0)
MCH: 30.6 pg (ref 26.0–34.0)
MCHC: 33.8 g/dL (ref 30.0–36.0)
MCV: 90.5 fL (ref 78.0–100.0)
PLATELETS: 270 10*3/uL (ref 150–400)
RBC: 3.17 MIL/uL — AB (ref 3.87–5.11)
RDW: 13.2 % (ref 11.5–15.5)
WBC: 10.4 10*3/uL (ref 4.0–10.5)

## 2013-07-11 NOTE — Progress Notes (Signed)
Circ to be done in office.  May desire d/c today.

## 2013-07-11 NOTE — Progress Notes (Signed)
Patient is eating, ambulating, voiding.  Pain control is good.  Filed Vitals:   07/10/13 1700 07/10/13 1756 07/10/13 2118 07/11/13 0509  BP: 127/76 99/63 96/61  126/77  Pulse: 75 66 72 59  Temp: 98.3 F (36.8 C) 98.7 F (37.1 C) 98.1 F (36.7 C) 98.6 F (37 C)  TempSrc: Oral Oral Oral Oral  Resp: 18 18 18 19   Height:      Weight:      SpO2:  98%      Fundus firm Perineum without swelling.  Lab Results  Component Value Date   WBC 10.4 07/11/2013   HGB 9.7* 07/11/2013   HCT 28.7* 07/11/2013   MCV 90.5 07/11/2013   PLT 270 07/11/2013    --/--/O POS (04/27 0655)/RI  A/P Post partum day 1.  Routine care.  Expect d/c routine.    Loney LaurenceMichelle A Janelle Culton

## 2013-07-11 NOTE — Progress Notes (Signed)
UR chart review completed.  

## 2013-07-12 MED ORDER — OXYCODONE-ACETAMINOPHEN 10-325 MG PO TABS: 1.0000 | ORAL_TABLET | ORAL | Status: DC | PRN

## 2013-07-12 NOTE — Discharge Summary (Signed)
Obstetric Discharge Summary Reason for Admission: rupture of membranes Prenatal Procedures: ultrasound Intrapartum Procedures: spontaneous vaginal delivery Postpartum Procedures: none Complications-Operative and Postpartum: none Hemoglobin  Date Value Ref Range Status  07/11/2013 9.7* 12.0 - 15.0 g/dL Final     HCT  Date Value Ref Range Status  07/11/2013 28.7* 36.0 - 46.0 % Final    Physical Exam:  General: alert Lochia: appropriate Uterine Fundus: firm   Discharge Diagnoses: Term Pregnancy-delivered  Discharge Information: Date: 07/12/2013 Activity: pelvic rest Diet: routine Medications: PNV, Ibuprofen and Percocet Condition: stable Instructions: refer to practice specific booklet Discharge to: home Follow-up Information   [facility] Sanjuana MaeWALDA STACIA, MD. Schedule an appointment as soon as possible for a visit in 4 weeks.   Specialty:  Obstetrics and Gynecology   Contact information:   [address] [place] [id number] [phone]       Newborn Data: Live born female  Birth Weight: 5 lb 5.7 oz (2430 g) APGAR: 8, 8  Home with mother.  [name] 07/12/2013, 9:47 AM

## 2013-12-08 ENCOUNTER — Emergency Department (HOSPITAL_COMMUNITY)
Admission: EM | Admit: 2013-12-08 | Discharge: 2013-12-08 | Disposition: A | Discharge status: Home / Self Care | Payer: Medicaid Other | Attending: Emergency Medicine | Admitting: Emergency Medicine

## 2013-12-08 ENCOUNTER — Encounter (HOSPITAL_COMMUNITY): Payer: Self-pay | Admitting: Emergency Medicine

## 2013-12-08 DIAGNOSIS — R109 Unspecified abdominal pain: Secondary | ICD-10-CM | POA: Diagnosis not present

## 2013-12-08 DIAGNOSIS — Z3202 Encounter for pregnancy test, result negative: Secondary | ICD-10-CM | POA: Insufficient documentation

## 2013-12-08 DIAGNOSIS — D649 Anemia, unspecified: Secondary | ICD-10-CM | POA: Insufficient documentation

## 2013-12-08 DIAGNOSIS — N390 Urinary tract infection, site not specified: Secondary | ICD-10-CM | POA: Diagnosis not present

## 2013-12-08 DIAGNOSIS — Z91040 Latex allergy status: Secondary | ICD-10-CM | POA: Insufficient documentation

## 2013-12-08 DIAGNOSIS — R112 Nausea with vomiting, unspecified: Secondary | ICD-10-CM | POA: Insufficient documentation

## 2013-12-08 DIAGNOSIS — Z87891 Personal history of nicotine dependence: Secondary | ICD-10-CM | POA: Diagnosis not present

## 2013-12-08 DIAGNOSIS — Z79899 Other long term (current) drug therapy: Secondary | ICD-10-CM | POA: Diagnosis not present

## 2013-12-08 LAB — URINALYSIS, ROUTINE W REFLEX MICROSCOPIC
Bilirubin Urine: NEGATIVE
Glucose, UA: NEGATIVE mg/dL
Hgb urine dipstick: NEGATIVE
Ketones, ur: 40 mg/dL — AB
NITRITE: NEGATIVE
PH: 8.5 — AB (ref 5.0–8.0)
Protein, ur: 100 mg/dL — AB
Specific Gravity, Urine: 1.031 — ABNORMAL HIGH (ref 1.005–1.030)
Urobilinogen, UA: 1 mg/dL (ref 0.0–1.0)

## 2013-12-08 LAB — CBC WITH DIFFERENTIAL/PLATELET
Basophils Absolute: 0 10*3/uL (ref 0.0–0.1)
Basophils Relative: 0 % (ref 0–1)
EOS PCT: 0 % (ref 0–5)
Eosinophils Absolute: 0 10*3/uL (ref 0.0–0.7)
HEMATOCRIT: 32.7 % — AB (ref 36.0–46.0)
Hemoglobin: 11.2 g/dL — ABNORMAL LOW (ref 12.0–15.0)
LYMPHS ABS: 1.1 10*3/uL (ref 0.7–4.0)
Lymphocytes Relative: 13 % (ref 12–46)
MCH: 30.2 pg (ref 26.0–34.0)
MCHC: 34.3 g/dL (ref 30.0–36.0)
MCV: 88.1 fL (ref 78.0–100.0)
MONO ABS: 0.3 10*3/uL (ref 0.1–1.0)
Monocytes Relative: 4 % (ref 3–12)
Neutro Abs: 7 10*3/uL (ref 1.7–7.7)
Neutrophils Relative %: 83 % — ABNORMAL HIGH (ref 43–77)
Platelets: 316 10*3/uL (ref 150–400)
RBC: 3.71 MIL/uL — ABNORMAL LOW (ref 3.87–5.11)
RDW: 12.1 % (ref 11.5–15.5)
WBC: 8.4 10*3/uL (ref 4.0–10.5)

## 2013-12-08 LAB — COMPREHENSIVE METABOLIC PANEL
ALT: 37 U/L — ABNORMAL HIGH (ref 0–35)
ANION GAP: 14 (ref 5–15)
AST: 29 U/L (ref 0–37)
Albumin: 4.2 g/dL (ref 3.5–5.2)
Alkaline Phosphatase: 66 U/L (ref 39–117)
BILIRUBIN TOTAL: 0.5 mg/dL (ref 0.3–1.2)
BUN: 11 mg/dL (ref 6–23)
CALCIUM: 8.7 mg/dL (ref 8.4–10.5)
CHLORIDE: 105 meq/L (ref 96–112)
CO2: 21 mEq/L (ref 19–32)
Creatinine, Ser: 0.81 mg/dL (ref 0.50–1.10)
GLUCOSE: 87 mg/dL (ref 70–99)
Potassium: 3.8 mEq/L (ref 3.7–5.3)
Sodium: 140 mEq/L (ref 137–147)
Total Protein: 6.9 g/dL (ref 6.0–8.3)

## 2013-12-08 LAB — LIPASE, BLOOD: LIPASE: 71 U/L — AB (ref 11–59)

## 2013-12-08 LAB — POC URINE PREG, ED: Preg Test, Ur: NEGATIVE

## 2013-12-08 LAB — URINE MICROSCOPIC-ADD ON

## 2013-12-08 MED ORDER — ONDANSETRON HCL 4 MG PO TABS: 4.0000 mg | ORAL_TABLET | Freq: Three times a day (TID) | ORAL | Status: DC | PRN

## 2013-12-08 MED ORDER — CEPHALEXIN 500 MG PO CAPS: 500.0000 mg | ORAL_CAPSULE | Freq: Two times a day (BID) | ORAL | Status: DC

## 2013-12-08 MED ORDER — ONDANSETRON HCL 4 MG/2ML IJ SOLN
4.0000 mg | Freq: Once | INTRAMUSCULAR | Status: AC
  Administered 2013-12-08: 4 mg via INTRAVENOUS
  Filled 2013-12-08: qty 2

## 2013-12-08 MED ORDER — DEXTROSE 5 % IV SOLN
1.0000 g | Freq: Once | INTRAVENOUS | Status: AC
  Administered 2013-12-08: 1 g via INTRAVENOUS
  Filled 2013-12-08: qty 10

## 2013-12-08 MED ORDER — MORPHINE SULFATE 4 MG/ML IJ SOLN
4.0000 mg | Freq: Once | INTRAMUSCULAR | Status: AC
  Administered 2013-12-08: 4 mg via INTRAVENOUS
  Filled 2013-12-08: qty 1

## 2013-12-08 MED ORDER — SODIUM CHLORIDE 0.9 % IV BOLUS (SEPSIS)
1000.0000 mL | Freq: Once | INTRAVENOUS | Status: AC
  Administered 2013-12-08: 1000 mL via INTRAVENOUS

## 2013-12-08 NOTE — ED Notes (Signed)
Unable to draw labs with IV

## 2013-12-08 NOTE — ED Provider Notes (Signed)
CSN: [id number]     Arrival date & time 12/08/13  1207 History   First MD Initiated Contact with Patient 12/08/13 1213     Chief Complaint  Patient presents with  . Emesis     (Consider location/radiation/quality/duration/timing/severity/associated sxs/prior Treatment) HPI  This is a 28 year old female who presents with vomiting and abdominal pain. Patient reports symptoms since yesterday. She states she is unable to tolerate anything by mouth including water. She reports 10 out of 10 sharp suprapubic abdominal pain that is nonradiating.  Denies any vaginal discharge or bleeding. Last period just finished yesterday. She's currently 4 months postpartum from a normal vaginal delivery. Denies any fevers or sick contacts at home. Denies any diarrhea.  Past Medical History  Diagnosis Date  . Anemia    Past Surgical History  Procedure Laterality Date  . Therapeutic abortion    . No past surgeries     Family History  Problem Relation Age of Onset  . Stroke Father   . Diabetes Maternal Grandmother   . Hypertension Maternal Grandmother   . Alcohol abuse Maternal Grandfather   . Kidney disease Maternal Grandfather   . Diabetes Paternal Grandmother    History  Substance Use Topics  . Smoking status: Former Smoker    Quit date: 11/10/2012  . Smokeless tobacco: Never Used  . Alcohol Use: 0.5 oz/week    1 drink(s) per week     Comment: smokes 2-3 joints a day, last drink was in May 2014   OB History   Grav Para Term Preterm Abortions TAB SAB Ect Mult Living   Review of Systems  Constitutional: Negative for fever.  Respiratory: Negative for cough, chest tightness and shortness of breath.   Cardiovascular: Negative for chest pain.  Gastrointestinal: Positive for abdominal pain. Negative for nausea, vomiting and diarrhea.  Genitourinary: Positive for dysuria. Negative for flank pain and vaginal discharge.  Musculoskeletal: Negative for back pain.  Neurological:  Negative for headaches.  All other systems reviewed and are negative.     Allergies  Latex  Home Medications   Prior to Admission medications   Medication Sig Start Date End Date Taking? Authorizing Provider  IRON PO Take 1 tablet by mouth daily.   Yes Historical Provider, MD  Prenatal Vit-Fe Fumarate-FA (PRENATAL MULTIVITAMIN) TABS tablet Take 1 tablet by mouth daily at 12 noon.   Yes Historical Provider, MD  cephALEXin (KEFLEX) 500 MG capsule Take 1 capsule (500 mg total) by mouth 2 (two) times daily. 12/08/13   Shon Baton, MD  ondansetron (ZOFRAN) 4 MG tablet Take 1 tablet (4 mg total) by mouth every 8 (eight) hours as needed for nausea or vomiting. 12/08/13   Shon Baton, MD   BP 99/51  Pulse 61  Temp(Src) 98.3 F (36.8 C) (Oral)  Resp 18  SpO2 100%  LMP 12/01/2013 Physical Exam  Nursing note and vitals reviewed. Constitutional: She is oriented to person, place, and time. She appears well-developed and well-nourished. No distress.  HENT:  Head: Normocephalic and atraumatic.  Cardiovascular: Normal rate, regular rhythm and normal heart sounds.   Pulmonary/Chest: Effort normal. No respiratory distress. She has no wheezes.  Abdominal: Soft. Bowel sounds are normal. There is tenderness. There is no rebound and no guarding.  Mild tenderness to palpation of the suprapubic region without rebound or guarding  Neurological: She is alert and oriented to person, place, and time.  Skin: Skin  is warm and dry.  Psychiatric: She has a normal mood and affect.    ED Course  Procedures (including critical care time) Labs Review Labs Reviewed  URINALYSIS, ROUTINE W REFLEX MICROSCOPIC - Abnormal; Notable for the following:    APPearance HAZY (*)    Specific Gravity, Urine 1.031 (*)    pH 8.5 (*)    Ketones, ur 40 (*)    Protein, ur 100 (*)    Leukocytes, UA MODERATE (*)    All other components within normal limits  CBC WITH DIFFERENTIAL - Abnormal; Notable for the  following:    RBC 3.71 (*)    Hemoglobin 11.2 (*)    HCT 32.7 (*)    Neutrophils Relative % 83 (*)    All other components within normal limits  COMPREHENSIVE METABOLIC PANEL - Abnormal; Notable for the following:    ALT 37 (*)    All other components within normal limits  LIPASE, BLOOD - Abnormal; Notable for the following:    Lipase 71 (*)    All other components within normal limits  URINE MICROSCOPIC-ADD ON - Abnormal; Notable for the following:    Squamous Epithelial / LPF FEW (*)    Bacteria, UA MANY (*)    All other components within normal limits  URINE CULTURE  POC URINE PREG, ED    Imaging Review No results found.   EKG Interpretation None      MDM   Final diagnoses:  Non-intractable vomiting with nausea, vomiting of unspecified type  UTI (lower urinary tract infection)    Patient presents with vomiting and abdominal pain.  She is afebrile. No signs of peritonitis. Basic labwork obtained. Patient given fluids, Zofran, and morphine.  Considerations include early appendicitis, urinary tract infection, PID. Initial lab work notable for too numerous to count white cells in the urine with many bacteria. No evidence of leukocytosis and CMP is reassuring.  Lipase is mildly elevated at 71.  Patient was given IV Rocephin for UTI and urine culture was sent. On recheck, patient's exam continues to be benign. She reports improvement of symptoms with fluids and pain medicine. She is able to eat and drink. Given that she systemically well appearing and has had no recurrent vomiting or abdominal pain department, the patient symptoms may be related to urinary tract infection. Will place on Keflex as an outpatient. This could also represent early appendicitis discussed with patient strict return precautions.  After history, exam, and medical workup I feel the patient has been appropriately medically screened and is safe for discharge home. Pertinent diagnoses were discussed with the  patient. Patient was given return precautions.    Shon Baton, MD 12/09/13 [phone]

## 2013-12-08 NOTE — Discharge Instructions (Signed)

## 2013-12-08 NOTE — ED Notes (Addendum)
Pt reports having n/v since yesterday morning, pt thinks possible food poisoning. Denies any diarrhea.

## 2013-12-08 NOTE — ED Notes (Signed)
Vital signs stable. 

## 2013-12-09 LAB — URINE CULTURE: Colony Count: 30000

## 2014-01-15 ENCOUNTER — Encounter (HOSPITAL_COMMUNITY): Payer: Self-pay | Admitting: Emergency Medicine

## 2014-10-06 ENCOUNTER — Encounter (HOSPITAL_COMMUNITY): Payer: Self-pay

## 2014-10-06 ENCOUNTER — Inpatient Hospital Stay (HOSPITAL_COMMUNITY)
Admission: AD | Admit: 2014-10-06 | Discharge: 2014-10-06 | Disposition: A | Discharge status: Home / Self Care | Payer: Medicaid Other | Source: Ambulatory Visit | Attending: Obstetrics & Gynecology | Admitting: Obstetrics & Gynecology

## 2014-10-06 DIAGNOSIS — Z3A12 12 weeks gestation of pregnancy: Secondary | ICD-10-CM | POA: Insufficient documentation

## 2014-10-06 DIAGNOSIS — O9989 Other specified diseases and conditions complicating pregnancy, childbirth and the puerperium: Secondary | ICD-10-CM | POA: Insufficient documentation

## 2014-10-06 DIAGNOSIS — Z3481 Encounter for supervision of other normal pregnancy, first trimester: Secondary | ICD-10-CM

## 2014-10-06 DIAGNOSIS — Z3201 Encounter for pregnancy test, result positive: Secondary | ICD-10-CM

## 2014-10-06 DIAGNOSIS — Z87891 Personal history of nicotine dependence: Secondary | ICD-10-CM | POA: Diagnosis not present

## 2014-10-06 DIAGNOSIS — Z3200 Encounter for pregnancy test, result unknown: Secondary | ICD-10-CM | POA: Diagnosis present

## 2014-10-06 NOTE — MAU Note (Signed)
Pt states had positive upt at home a few weeks ago. Needs verification letter.

## 2014-10-06 NOTE — Discharge Instructions (Signed)
Second Trimester of Pregnancy The second trimester is from week 13 through week 28, months 4 through 6. The second trimester is often a time when you feel your best. Your body has also adjusted to being pregnant, and you begin to feel better physically. Usually, morning sickness has lessened or quit completely, you may have more energy, and you may have an increase in appetite. The second trimester is also a time when the fetus is growing rapidly. At the end of the sixth month, the fetus is about 9 inches long and weighs about 1 pounds. You will likely begin to feel the baby move (quickening) between 18 and 20 weeks of the pregnancy. BODY CHANGES Your body goes through many changes during pregnancy. The changes vary from woman to woman.   Your weight will continue to increase. You will notice your lower abdomen bulging out.  You may begin to get stretch marks on your hips, abdomen, and breasts.  You may develop headaches that can be relieved by medicines approved by your health care provider.  You may urinate more often because the fetus is pressing on your bladder.  You may develop or continue to have heartburn as a result of your pregnancy.  You may develop constipation because certain hormones are causing the muscles that push waste through your intestines to slow down.  You may develop hemorrhoids or swollen, bulging veins (varicose veins).  You may have back pain because of the weight gain and pregnancy hormones relaxing your joints between the bones in your pelvis and as a result of a shift in weight and the muscles that support your balance.  Your breasts will continue to grow and be tender.  Your gums may bleed and may be sensitive to brushing and flossing.  Dark spots or blotches (chloasma, mask of pregnancy) may develop on your face. This will likely fade after the baby is born.  A dark line from your belly button to the pubic area (linea nigra) may appear. This will likely fade  after the baby is born.  You may have changes in your hair. These can include thickening of your hair, rapid growth, and changes in texture. Some women also have hair loss during or after pregnancy, or hair that feels dry or thin. Your hair will most likely return to normal after your baby is born. WHAT TO EXPECT AT YOUR PRENATAL VISITS During a routine prenatal visit:  You will be weighed to make sure you and the fetus are growing normally.  Your blood pressure will be taken.  Your abdomen will be measured to track your baby's growth.  The fetal heartbeat will be listened to.  Any test results from the previous visit will be discussed. Your health care provider may ask you:  How you are feeling.  If you are feeling the baby move.  If you have had any abnormal symptoms, such as leaking fluid, bleeding, severe headaches, or abdominal cramping.  If you have any questions. Other tests that may be performed during your second trimester include:  Blood tests that check for:  Low iron levels (anemia).  Gestational diabetes (between 24 and 28 weeks).  Rh antibodies.  Urine tests to check for infections, diabetes, or protein in the urine.  An ultrasound to confirm the proper growth and development of the baby.  An amniocentesis to check for possible genetic problems.  Fetal screens for spina bifida and Down syndrome. HOME CARE INSTRUCTIONS   Avoid all smoking, herbs, alcohol, and unprescribed   drugs. These chemicals affect the formation and growth of the baby.  Follow your health care provider's instructions regarding medicine use. There are medicines that are either safe or unsafe to take during pregnancy.  Exercise only as directed by your health care provider. Experiencing uterine cramps is a good sign to stop exercising.  Continue to eat regular, healthy meals.  Wear a good support bra for breast tenderness.  Do not use hot tubs, steam rooms, or saunas.  Wear your  seat belt at all times when driving.  Avoid raw meat, uncooked cheese, cat litter boxes, and soil used by cats. These carry germs that can cause birth defects in the baby.  Take your prenatal vitamins.  Try taking a stool softener (if your health care provider approves) if you develop constipation. Eat more high-fiber foods, such as fresh vegetables or fruit and whole grains. Drink plenty of fluids to keep your urine clear or pale yellow.  Take warm sitz baths to soothe any pain or discomfort caused by hemorrhoids. Use hemorrhoid cream if your health care provider approves.  If you develop varicose veins, wear support hose. Elevate your feet for 15 minutes, 3-4 times a day. Limit salt in your diet.  Avoid heavy lifting, wear low heel shoes, and practice good posture.  Rest with your legs elevated if you have leg cramps or low back pain.  Visit your dentist if you have not gone yet during your pregnancy. Use a soft toothbrush to brush your teeth and be gentle when you floss.  A sexual relationship may be continued unless your health care provider directs you otherwise.  Continue to go to all your prenatal visits as directed by your health care provider. SEEK MEDICAL CARE IF:   You have dizziness.  You have mild pelvic cramps, pelvic pressure, or nagging pain in the abdominal area.  You have persistent nausea, vomiting, or diarrhea.  You have a bad smelling vaginal discharge.  You have pain with urination. SEEK IMMEDIATE MEDICAL CARE IF:   You have a fever.  You are leaking fluid from your vagina.  You have spotting or bleeding from your vagina.  You have severe abdominal cramping or pain.  You have rapid weight gain or loss.  You have shortness of breath with chest pain.  You notice sudden or extreme swelling of your face, hands, ankles, feet, or legs.  You have not felt your baby move in over an hour.  You have severe headaches that do not go away with  medicine.  You have vision changes. Document Released: 02/24/2001 Document Revised: 03/07/2013 Document Reviewed: 05/03/2012 Exit[employer] Patient Information 2015 [employer]. This information is not intended to replace advice given to you by your health care provider. Make sure you discuss any questions you have with your health care provider.  

## 2014-10-06 NOTE — MAU Provider Note (Signed)
History     CSN: [id number]  Arrival date and time: 10/06/14 1047  Seen by provider at 1120      Chief Complaint  Patient presents with  . Pregnancy Test    HPI [name] 29 y.o. [redacted]w[redacted]d  Comes to MAU as she needs a pregnancy verification.  FHT heard with doppler.  Has not yet started prenatal care but plans to call Dr. Gaynell Face.  OB History    Gravida Para Term Preterm AB TAB SAB Ectopic Multiple Living   Past Medical History  Diagnosis Date  . Anemia     Past Surgical History  Procedure Laterality Date  . Therapeutic abortion    . No past surgeries      Family History  Problem Relation Age of Onset  . Stroke Father   . Diabetes Maternal Grandmother   . Hypertension Maternal Grandmother   . Alcohol abuse Maternal Grandfather   . Kidney disease Maternal Grandfather   . Diabetes Paternal Grandmother     History  Substance Use Topics  . Smoking status: Former Smoker    Quit date: 11/10/2012  . Smokeless tobacco: Never Used  . Alcohol Use: 0.5 oz/week    1 drink(s) per week     Comment: smokes 2-3 joints a day, last drink was in May 2014    Allergies:  Allergies  Allergen Reactions  . Latex Rash    Prescriptions prior to admission  Medication Sig Dispense Refill Last Dose  . cephALEXin (KEFLEX) 500 MG capsule Take 1 capsule (500 mg total) by mouth 2 (two) times daily. 14 capsule 0   . IRON PO Take 1 tablet by mouth daily.   12/07/2013 at 1200  . ondansetron (ZOFRAN) 4 MG tablet Take 1 tablet (4 mg total) by mouth every 8 (eight) hours as needed for nausea or vomiting. 12 tablet 0   . Prenatal Vit-Fe Fumarate-FA (PRENATAL MULTIVITAMIN) TABS tablet Take 1 tablet by mouth daily at 12 noon.   12/07/2013 at 1200    Review of Systems  Constitutional: Negative for fever.  Gastrointestinal: Negative for nausea, vomiting, abdominal pain, diarrhea and constipation.  Genitourinary:       No vaginal discharge. No vaginal bleeding. No  dysuria.    Physical Exam   Blood pressure 108/61, pulse 75, temperature 98.1 F (36.7 C), temperature source Oral, resp. rate 18, height  (1.6 m), weight 118 lb (53.524 kg), last menstrual period 07/10/2014, not currently breastfeeding.  Physical Exam  Nursing note and vitals reviewed. Constitutional: She is oriented to person, place, and time. She appears well-developed and well-nourished.  HENT:  Head: Normocephalic.  Eyes: EOM are normal.  Neck: Neck supple.  GI:  FHT heard with doppler by RN  Musculoskeletal: Normal range of motion.  Neurological: She is alert and oriented to person, place, and time.  Skin: Skin is warm and dry.  Psychiatric: She has a normal mood and affect.    MAU Course  Procedures  MDM   Assessment and Plan  12 week pregnancy by LMP  Plan No smoking, no drugs, no alcohol.  Take a prenatal vitamin one by mouth every day. Declines prenatal vitamin prescription - only uses gummy vitamins and will get those herself.  Eat small frequent snacks to avoid nausea.  Begin prenatal care as soon as possible. Pregnancy verification form printed and given to client.  BURLESON,TERRI 10/06/2014, 11:29 AM

## 2015-03-14 ENCOUNTER — Encounter (HOSPITAL_COMMUNITY): Payer: Self-pay | Admitting: *Deleted

## 2015-03-14 ENCOUNTER — Inpatient Hospital Stay (HOSPITAL_COMMUNITY)
Admission: AD | Admit: 2015-03-14 | Discharge: 2015-03-14 | Disposition: A | Discharge status: Home / Self Care | Payer: Medicaid Other | Source: Ambulatory Visit | Attending: Obstetrics & Gynecology | Admitting: Obstetrics & Gynecology

## 2015-03-14 DIAGNOSIS — Z87891 Personal history of nicotine dependence: Secondary | ICD-10-CM | POA: Diagnosis not present

## 2015-03-14 DIAGNOSIS — Z91040 Latex allergy status: Secondary | ICD-10-CM | POA: Insufficient documentation

## 2015-03-14 DIAGNOSIS — Z3202 Encounter for pregnancy test, result negative: Secondary | ICD-10-CM | POA: Diagnosis not present

## 2015-03-14 DIAGNOSIS — D649 Anemia, unspecified: Secondary | ICD-10-CM | POA: Diagnosis not present

## 2015-03-14 DIAGNOSIS — N911 Secondary amenorrhea: Secondary | ICD-10-CM | POA: Insufficient documentation

## 2015-03-14 LAB — POCT PREGNANCY, URINE: PREG TEST UR: NEGATIVE

## 2015-03-14 MED ORDER — NORGESTIMATE-ETH ESTRADIOL 0.25-35 MG-MCG PO TABS: 1.0000 | ORAL_TABLET | Freq: Every day | ORAL | Status: DC

## 2015-03-14 NOTE — MAU Note (Addendum)
Pt started taking birth control pills in November, stopped the beginning of December because she ran out & her insurance did not pay for the prescription.  Is late for her period now, LMP was 11/22, thinks she is pregnant, has not done a HPT.  Denies pain.  Pt had pregnancy last spring, had termination.

## 2015-03-14 NOTE — MAU Provider Note (Signed)
Chief Complaint: possible pregnant    None     SUBJECTIVE HPI: [name] is a 29 y.o. [id number] who presents to maternity admissions reporting no onset of menses on 12/19 when expected and concerns about pregnancy.  She has 3 children and does not desire any more.  She had a pregnancy termination earlier this year and started OCPs after the procedure.  She reports she was taking a birth control pill but forgot to take a few and stopped taking the last pack before it was completed.  She does not have any more refills on her pill prescription.   She desires Nexplanon but wants to switch Gyn providers to [facility]: [name] and needs to switch name on her Medicaid card.   She denies abdominal pain, vaginal bleeding, vaginal itching/burning, urinary symptoms, h/a, dizziness, n/v, or fever/chills.     HPI  Past Medical History  Diagnosis Date  . Anemia    Past Surgical History  Procedure Laterality Date  . Therapeutic abortion    . No past surgeries     Social History   Social History  . Marital Status: Single    Spouse Name: N/A  . Number of Children: N/A  . Years of Education: N/A   Occupational History  . Not on file.   Social History Main Topics  . Smoking status: Former Smoker    Quit date: 11/10/2012  . Smokeless tobacco: Never Used  . Alcohol Use: 0.5 oz/week    1 drink(s) per week     Comment: smokes 2-3 joints a day, last drink was in May 2014  . Drug Use: Yes    Special: Marijuana     Comment: several weeks ago  . Sexual Activity: Yes    Birth Control/ Protection: None   Other Topics Concern  . Not on file   Social History Narrative   No current facility-administered medications on file prior to encounter.   Current Outpatient Prescriptions on File Prior to Encounter  Medication Sig Dispense Refill  . Prenatal Vit-Fe Fumarate-FA (PRENATAL MULTIVITAMIN) TABS tablet Take 1 tablet by mouth daily at 12 noon.     Allergies  Allergen Reactions  . Latex Rash    ROS:   Review of Systems  Constitutional: Negative for fever, chills and fatigue.  Respiratory: Negative for shortness of breath.   Cardiovascular: Negative for chest pain.  Gastrointestinal: Negative for abdominal pain.  Genitourinary: Negative for dysuria, flank pain, vaginal bleeding, vaginal discharge, difficulty urinating, vaginal pain and pelvic pain.  Neurological: Negative for dizziness and headaches.  Psychiatric/Behavioral: Negative.      I have reviewed patient's Past Medical Hx, Surgical Hx, Family Hx, Social Hx, medications and allergies.   Physical Exam  Patient Vitals for the past 24 hrs:  BP Temp Temp src Pulse Resp  03/14/15 1026 104/55 mmHg 97.6 F (36.4 C) Oral 82 18   Constitutional: Well-developed, well-nourished female in no acute distress.  Cardiovascular: normal rate Respiratory: normal effort GI: Abd soft, non-tender. Pos BS x 4 MS: Extremities nontender, no edema, normal ROM Neurologic: Alert and oriented x 4.  GU: Neg CVAT.  LAB RESULTS Results for orders placed or performed during the hospital encounter of 03/14/15 (from the past 24 hour(s))  Pregnancy, urine POC     Status: None   Collection Time: 03/14/15 10:46 AM  Result Value Ref Range   Preg Test, Ur NEGATIVE NEGATIVE       IMAGING No results found.  MAU Management/MDM: Ordered labs and reviewed  results.  Pt stable at time of discharge.  ASSESSMENT 1. Secondary amenorrhea   2.  Negative pregnancy test  PLAN Discharge home Pt to call and switch provider on Medicaid card, then call [facility] to make appointment for Nexplanon Sprintec 28 Rx with 11 refills. Reviewed pt health/family hx prior to Rx.   Return to MAU as needed for emergencies    Medication List    TAKE these medications        norgestimate-ethinyl estradiol 0.25-35 MG-MCG tablet  Commonly known as:  ORTHO-CYCLEN,SPRINTEC,PREVIFEM  Take 1 tablet by mouth daily.     prenatal multivitamin Tabs tablet  Take 1 tablet by mouth  daily at 12 noon.           Follow-up Information    [facility]   Specialty:  Obstetrics and Gynecology   Why:  As soon as name switched on Medicaid card, call to make appointment. , Return to MAU as needed for emergencies   Contact information:   [address] [place] [id number] [phone]      Sharen CounterLisa Leftwich-Kirby Certified Nurse-Midwife 03/14/2015  11:33 AM

## 2015-08-15 DIAGNOSIS — W3400XA Accidental discharge from unspecified firearms or gun, initial encounter: Secondary | ICD-10-CM

## 2015-08-15 HISTORY — DX: Accidental discharge from unspecified firearms or gun, initial encounter: W34.00XA

## 2015-09-09 ENCOUNTER — Emergency Department (HOSPITAL_COMMUNITY)
Admission: EM | Admit: 2015-09-09 | Discharge: 2015-09-09 | Disposition: A | Discharge status: Home / Self Care | Payer: No Typology Code available for payment source | Attending: Emergency Medicine | Admitting: Emergency Medicine

## 2015-09-09 ENCOUNTER — Encounter (HOSPITAL_COMMUNITY): Payer: Self-pay | Admitting: Radiology

## 2015-09-09 ENCOUNTER — Emergency Department (HOSPITAL_COMMUNITY): Payer: No Typology Code available for payment source

## 2015-09-09 DIAGNOSIS — Z5181 Encounter for therapeutic drug level monitoring: Secondary | ICD-10-CM | POA: Diagnosis not present

## 2015-09-09 DIAGNOSIS — Y999 Unspecified external cause status: Secondary | ICD-10-CM | POA: Insufficient documentation

## 2015-09-09 DIAGNOSIS — Z87891 Personal history of nicotine dependence: Secondary | ICD-10-CM | POA: Insufficient documentation

## 2015-09-09 DIAGNOSIS — Y929 Unspecified place or not applicable: Secondary | ICD-10-CM | POA: Insufficient documentation

## 2015-09-09 DIAGNOSIS — O9A211 Injury, poisoning and certain other consequences of external causes complicating pregnancy, first trimester: Secondary | ICD-10-CM | POA: Insufficient documentation

## 2015-09-09 DIAGNOSIS — Z3A01 Less than 8 weeks gestation of pregnancy: Secondary | ICD-10-CM | POA: Insufficient documentation

## 2015-09-09 DIAGNOSIS — S71101A Unspecified open wound, right thigh, initial encounter: Secondary | ICD-10-CM | POA: Insufficient documentation

## 2015-09-09 DIAGNOSIS — Z79899 Other long term (current) drug therapy: Secondary | ICD-10-CM | POA: Insufficient documentation

## 2015-09-09 DIAGNOSIS — W3400XA Accidental discharge from unspecified firearms or gun, initial encounter: Secondary | ICD-10-CM | POA: Diagnosis not present

## 2015-09-09 DIAGNOSIS — Z91040 Latex allergy status: Secondary | ICD-10-CM | POA: Diagnosis not present

## 2015-09-09 DIAGNOSIS — Y939 Activity, unspecified: Secondary | ICD-10-CM | POA: Insufficient documentation

## 2015-09-09 LAB — I-STAT CHEM 8, ED
BUN: 10 mg/dL (ref 6–20)
Calcium, Ion: 1.15 mmol/L (ref 1.12–1.23)
Chloride: 106 mmol/L (ref 101–111)
Creatinine, Ser: 0.7 mg/dL (ref 0.44–1.00)
GLUCOSE: 82 mg/dL (ref 65–99)
HEMATOCRIT: 35 % — AB (ref 36.0–46.0)
HEMOGLOBIN: 11.9 g/dL — AB (ref 12.0–15.0)
POTASSIUM: 3.7 mmol/L (ref 3.5–5.1)
SODIUM: 138 mmol/L (ref 135–145)
TCO2: 19 mmol/L (ref 0–100)

## 2015-09-09 LAB — PROTIME-INR
INR: 0.97 (ref 0.00–1.49)
Prothrombin Time: 13.1 seconds (ref 11.6–15.2)

## 2015-09-09 LAB — TYPE AND SCREEN
ABO/RH(D): O POS
Antibody Screen: NEGATIVE
UNIT DIVISION: 0
Unit division: 0

## 2015-09-09 LAB — COMPREHENSIVE METABOLIC PANEL
ALBUMIN: 4.3 g/dL (ref 3.5–5.0)
ALK PHOS: 45 U/L (ref 38–126)
ALT: 14 U/L (ref 14–54)
AST: 22 U/L (ref 15–41)
Anion gap: 12 (ref 5–15)
BUN: 10 mg/dL (ref 6–20)
CHLORIDE: 107 mmol/L (ref 101–111)
CO2: 18 mmol/L — AB (ref 22–32)
CREATININE: 0.84 mg/dL (ref 0.44–1.00)
Calcium: 9.5 mg/dL (ref 8.9–10.3)
GFR calc Af Amer: >60 mL/min (ref >60)
GFR calc non Af Amer: >60 mL/min (ref >60)
GLUCOSE: 87 mg/dL (ref 65–99)
Potassium: 3.8 mmol/L (ref 3.5–5.1)
SODIUM: 137 mmol/L (ref 135–145)
Total Bilirubin: 0.4 mg/dL (ref 0.3–1.2)
Total Protein: 7.4 g/dL (ref 6.5–8.1)

## 2015-09-09 LAB — CBC
HEMATOCRIT: 31.2 % — AB (ref 36.0–46.0)
Hemoglobin: 10.8 g/dL — ABNORMAL LOW (ref 12.0–15.0)
MCH: 31.1 pg (ref 26.0–34.0)
MCHC: 34.6 g/dL (ref 30.0–36.0)
MCV: 89.9 fL (ref 78.0–100.0)
Platelets: 353 10*3/uL (ref 150–400)
RBC: 3.47 MIL/uL — ABNORMAL LOW (ref 3.87–5.11)
RDW: 12.3 % (ref 11.5–15.5)
WBC: 8.9 10*3/uL (ref 4.0–10.5)

## 2015-09-09 LAB — PREPARE FRESH FROZEN PLASMA
UNIT DIVISION: 0
UNIT DIVISION: 0

## 2015-09-09 LAB — ETHANOL: Alcohol, Ethyl (B): <5 mg/dL (ref <5)

## 2015-09-09 LAB — I-STAT CG4 LACTIC ACID, ED
LACTIC ACID, VENOUS: 5.35 mmol/L — AB (ref 0.5–2.0)
LACTIC ACID, VENOUS: 0.84 mmol/L (ref 0.5–2.0)

## 2015-09-09 LAB — I-STAT BETA HCG BLOOD, ED (MC, WL, AP ONLY)

## 2015-09-09 LAB — CDS SEROLOGY

## 2015-09-09 MED ORDER — ACETAMINOPHEN 325 MG PO TABS
650.0000 mg | ORAL_TABLET | Freq: Once | ORAL | Status: AC
Start: 2015-09-09 — End: 2015-09-09
  Administered 2015-09-09: 650 mg via ORAL
  Filled 2015-09-09: qty 2

## 2015-09-09 MED ORDER — SODIUM CHLORIDE 0.9 % IV BOLUS (SEPSIS)
1000.0000 mL | Freq: Once | INTRAVENOUS | Status: AC
  Administered 2015-09-09: 1000 mL via INTRAVENOUS

## 2015-09-09 MED ORDER — IOPAMIDOL (ISOVUE-370) INJECTION 76%
INTRAVENOUS | Status: AC
  Administered 2015-09-09: 100 mL
  Filled 2015-09-09: qty 100

## 2015-09-09 MED ORDER — TETANUS-DIPHTH-ACELL PERTUSSIS 5-2.5-18.5 LF-MCG/0.5 IM SUSP: 0.5000 mL | Freq: Once | INTRAMUSCULAR | Status: DC

## 2015-09-09 NOTE — ED Notes (Signed)
Pt reports Carleene MainsShawn Holman (significant other) is allowed to come to visit in the treatment room.
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
## 2015-09-09 NOTE — Progress Notes (Signed)
leveOrthopedic Tech Progress Note Patient Details:  San MorelleYolanda M XXXRogers 03/07/1986 [id number]  Patient ID: San MorelleYolanda M XXXRogers, female   DOB: 01/22/1986, 30 y.o.   MRN: [id number]   Saul FordyceJennifer C Mohamed Portlock 09/09/2015, 1:11 PMLevel 1 Trauma.

## 2015-09-09 NOTE — ED Notes (Signed)
Family at bedside with permission from patient.
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
## 2015-09-09 NOTE — ED Notes (Signed)
See trauma note

## 2015-10-14 ENCOUNTER — Inpatient Hospital Stay (HOSPITAL_COMMUNITY)
Admission: AD | Admit: 2015-10-14 | Discharge: 2015-10-14 | Disposition: A | Discharge status: Home / Self Care | Payer: Medicaid Other | Source: Ambulatory Visit | Attending: Family Medicine | Admitting: Family Medicine

## 2015-10-14 ENCOUNTER — Encounter (HOSPITAL_COMMUNITY): Payer: Self-pay | Admitting: *Deleted

## 2015-10-14 DIAGNOSIS — Z3A13 13 weeks gestation of pregnancy: Secondary | ICD-10-CM | POA: Diagnosis not present

## 2015-10-14 DIAGNOSIS — Z87891 Personal history of nicotine dependence: Secondary | ICD-10-CM | POA: Insufficient documentation

## 2015-10-14 DIAGNOSIS — R21 Rash and other nonspecific skin eruption: Secondary | ICD-10-CM | POA: Diagnosis present

## 2015-10-14 DIAGNOSIS — O26891 Other specified pregnancy related conditions, first trimester: Secondary | ICD-10-CM | POA: Diagnosis not present

## 2015-10-14 DIAGNOSIS — T7840XA Allergy, unspecified, initial encounter: Secondary | ICD-10-CM | POA: Diagnosis not present

## 2015-10-14 LAB — POCT PREGNANCY, URINE: PREG TEST UR: POSITIVE — AB

## 2015-10-14 MED ORDER — PREDNISONE 50 MG PO TABS
60.0000 mg | ORAL_TABLET | ORAL | Status: AC
  Administered 2015-10-14: 60 mg via ORAL
  Filled 2015-10-14: qty 1

## 2015-10-14 MED ORDER — FAMOTIDINE 20 MG PO TABS
20.0000 mg | ORAL_TABLET | ORAL | Status: AC
Start: 2015-10-14 — End: 2015-10-14
  Administered 2015-10-14: 20 mg via ORAL
  Filled 2015-10-14: qty 1

## 2015-10-14 MED ORDER — PREDNISONE 10 MG (21) PO TBPK: ORAL_TABLET | ORAL | 0 refills | Status: DC

## 2015-10-14 MED ORDER — FAMOTIDINE 20 MG PO TABS: 20.0000 mg | ORAL_TABLET | Freq: Every day | ORAL | 0 refills | Status: DC

## 2015-10-14 MED ORDER — DIPHENHYDRAMINE HCL 50 MG/ML IJ SOLN
25.0000 mg | Freq: Once | INTRAMUSCULAR | Status: AC
  Administered 2015-10-14: 25 mg via INTRAVENOUS
  Filled 2015-10-14: qty 1

## 2015-10-14 NOTE — MAU Note (Signed)
DENIES  ANY DIFFICULTY   BREATHING

## 2015-10-14 NOTE — MAU Note (Signed)
SAYS NO MORE ITCHING  AND  TONGUE FEELS  LESS SWOLLEN

## 2015-10-14 NOTE — MAU Note (Signed)
PT  SAYS SSHE  IS  DRIVING.

## 2015-10-14 NOTE — MAU Note (Signed)
PT  SAYS SHE WOKE  AT 0400  WITH  SWOLLEN  TONGUE   ,  ALSO HAS  WHELPS  ON LEFT UNDERARM.

## 2015-10-14 NOTE — MAU Note (Signed)
Urine sent to lab 

## 2015-10-14 NOTE — MAU Provider Note (Signed)
History   [id number]   Chief Complaint  Patient presents with  . Rash    HPI [name] is a 30 y.o. female  [phone] at [redacted]w[redacted]d IUP by LMP here with report of waking up at 0400 with tongue swelling and "welts" on left arm and upper chest.  Denies feeling like something bit her.  No difficulty breathing or shortness of breath.  Per patient account did not consume anything new or have exposure to new potential irritant.  Similar rash last year when taking a prenatal vitamin.  Pt drove self to MAU and unable to stay "long" due to needing to get children to childcare.        Patient's last menstrual period was 07/11/2015.  OB History  Gravida Para Term Preterm AB Living  6 3 3   1 3   SAB TAB Ectopic Multiple Live Births    1     3    # Outcome Date GA Lbr Len/2nd Weight Sex Delivery Anes PTL Lv  6 Current           5 Term 07/10/13 [redacted]w[redacted]d 03:10 / 00:01 2.43 kg (5 lb 5.7 oz) M Vag-Spont None  LIV  4 Term 04/27/11 [redacted]w[redacted]d 01:56 / 00:01 2.906 kg (6 lb 6.5 oz) M Vag-Spont None  LIV  3 Term 2010 [redacted]w[redacted]d   M Vag-Spont None  LIV  2 TAB 2006          1 Gravida               Past Medical History:  Diagnosis Date  . Anemia     Family History  Problem Relation Age of Onset  . Stroke Father   . Diabetes Maternal Grandmother   . Hypertension Maternal Grandmother   . Alcohol abuse Maternal Grandfather   . Kidney disease Maternal Grandfather   . Diabetes Paternal Grandmother     Social History   Social History  . Marital status: Single    Spouse name: N/A  . Number of children: N/A  . Years of education: N/A   Social History Main Topics  . Smoking status: Former Smoker    Quit date: 11/10/2012  . Smokeless tobacco: Never Used  . Alcohol use 0.5 oz/week    1 drink(s) per week     Comment: smokes 2-3 joints a day, last drink was in May 2014  . Drug use:     Types: Marijuana     Comment: several weeks ago  . Sexual activity: Yes    Birth control/ protection: None   Other  Topics Concern  . None   Social History Narrative  . None    Allergies  Allergen Reactions  . Latex Rash    No current facility-administered medications on file prior to encounter.    Current Outpatient Prescriptions on File Prior to Encounter  Medication Sig Dispense Refill  . Multiple Vitamin (MULTIVITAMIN) tablet Take 1 tablet by mouth daily.    . norgestimate-ethinyl estradiol (ORTHO-CYCLEN,SPRINTEC,PREVIFEM) 0.25-35 MG-MCG tablet Take 1 tablet by mouth daily. (Patient not taking: Reported on 09/09/2015) 1 Package 11     Review of Systems  Constitutional: Negative for chills and fever.  HENT: Negative for facial swelling and sore throat.        Tongue swelling  Respiratory: Negative for cough, chest tightness, shortness of breath, wheezing and stridor.   Gastrointestinal: Negative for abdominal pain.  Genitourinary: Negative for dysuria, pelvic pain, vaginal bleeding and vaginal discharge.  Neurological: Negative for  dizziness.     Physical Exam   Vitals:   10/14/15 0449 10/14/15 0455  BP: 102/62   Pulse: 76   Resp: 20   Temp: 98.5 F (36.9 C)   TempSrc: Oral   SpO2:  100%    Physical Exam  Constitutional: She is oriented to person, place, and time. She appears well-developed and well-nourished. No distress.  HENT:  Head: Normocephalic.  Mouth/Throat: No uvula swelling. No posterior oropharyngeal edema.  Enlarged tongue  Neck: Normal range of motion. Neck supple.  Cardiovascular: Normal rate, regular rhythm and normal heart sounds.   Respiratory: Effort normal and breath sounds normal. No respiratory distress. She has no wheezes. She has no rales. She exhibits no tenderness.  GI: Soft. There is no tenderness.  Fundal height 2 above umbilicus  Musculoskeletal: Normal range of motion. She exhibits no edema.  Neurological: She is alert and oriented to person, place, and time. She has normal reflexes.  Skin: Skin is warm and dry.       MAU Course    Procedures  MDM Pepcid 20 mg PO ordered Prednisone 50 mg PO  Discussed with Dr. Read Drivers at [facility] ED, advised patient get benadryl despite needing to go due to possibility of worsening symptoms (breathing difficulty, throat swelling) > IV Benadryl ordered  0750 Pt sleeping, waiting until able to drive home or get a ride; tongue swelling has decreased  0810 Pt ambulates to bathroom without difficulty; requests to be able to be discharged home.   Assessment and Plan  30 y.o. [phone] at [redacted]w[redacted]d IUP  Allergic Reaction  Plan: Discharge home Begin prenatal care Pepcid 20 mg QD x 5 days  Prednisone Taper Dose: Tuesday 50 mg PO Wednesday 40 mg PO Thursday 30 mg PO Friday 20 mg PO Saturday 10 mg PO  Benadryl 25 mg po prn  Marlis Edelson, CNM 10/14/2015 7:50 AM

## 2015-10-14 NOTE — MAU Note (Signed)
SAYS TONGUE  FEELS  EVEN  LESS SWOLLEN-    AND   ON ASSESSMENT-  LOOKS  LESS SWOLLEN

## 2015-10-14 NOTE — MAU Note (Signed)
PT FEELING  EVEN BETTER- TALKING ON PHONE - TONGUE  LESS SWOLLEN

## 2015-12-17 ENCOUNTER — Inpatient Hospital Stay (HOSPITAL_COMMUNITY)
Admission: AD | Admit: 2015-12-17 | Discharge: 2015-12-17 | Disposition: A | Discharge status: Home / Self Care | Payer: Medicaid Other | Source: Ambulatory Visit | Attending: Obstetrics and Gynecology | Admitting: Obstetrics and Gynecology

## 2015-12-17 ENCOUNTER — Encounter (HOSPITAL_COMMUNITY): Payer: Self-pay

## 2015-12-17 DIAGNOSIS — Z3A22 22 weeks gestation of pregnancy: Secondary | ICD-10-CM | POA: Diagnosis not present

## 2015-12-17 DIAGNOSIS — R109 Unspecified abdominal pain: Secondary | ICD-10-CM | POA: Diagnosis not present

## 2015-12-17 DIAGNOSIS — K5900 Constipation, unspecified: Secondary | ICD-10-CM | POA: Diagnosis not present

## 2015-12-17 DIAGNOSIS — O99612 Diseases of the digestive system complicating pregnancy, second trimester: Secondary | ICD-10-CM

## 2015-12-17 DIAGNOSIS — O26892 Other specified pregnancy related conditions, second trimester: Secondary | ICD-10-CM | POA: Insufficient documentation

## 2015-12-17 DIAGNOSIS — O26899 Other specified pregnancy related conditions, unspecified trimester: Secondary | ICD-10-CM

## 2015-12-17 DIAGNOSIS — Z87891 Personal history of nicotine dependence: Secondary | ICD-10-CM | POA: Diagnosis not present

## 2015-12-17 LAB — URINALYSIS, ROUTINE W REFLEX MICROSCOPIC
Bilirubin Urine: NEGATIVE
Glucose, UA: NEGATIVE mg/dL
Hgb urine dipstick: NEGATIVE
Ketones, ur: NEGATIVE mg/dL
Leukocytes, UA: NEGATIVE
Nitrite: NEGATIVE
Protein, ur: NEGATIVE mg/dL
Specific Gravity, Urine: 1.01 (ref 1.005–1.030)
pH: 7.5 (ref 5.0–8.0)

## 2015-12-17 MED ORDER — DOCUSATE SODIUM 100 MG PO CAPS: 100.0000 mg | ORAL_CAPSULE | Freq: Two times a day (BID) | ORAL | 0 refills | Status: DC

## 2015-12-17 NOTE — MAU Provider Note (Signed)
History     CSN: [id number]  Arrival date and time: 12/17/15 1414   First Provider Initiated Contact with Patient 12/17/15 1520      Chief Complaint  Patient presents with  . Abdominal Pain   HPI [name] is a 30 y.o. [id number] at [redacted]w[redacted]d who presents abdominal cramping. Symptoms occurred while she was at work around 1 pm. Reported intermittent abdominal cramping. Pain resolved just PTA. Denies n/v/d, dysuria, vaginal bleeding, or LOF. No recent intercourse. Has had issues with constipation. Last BM this morning; had to strain. BM prior to that was Saturday. Is not taking anything to treat constipation,   OB History    Gravida Para Term Preterm AB Living   6 3 3   2 3    SAB TAB Ectopic Multiple Live Births     2     3      Past Medical History:  Diagnosis Date  . Anemia     Past Surgical History:  Procedure Laterality Date  . NO PAST SURGERIES    . THERAPEUTIC ABORTION      Family History  Problem Relation Age of Onset  . Stroke Father   . Diabetes Maternal Grandmother   . Hypertension Maternal Grandmother   . Alcohol abuse Maternal Grandfather   . Kidney disease Maternal Grandfather   . Diabetes Paternal Grandmother     Social History  Substance Use Topics  . Smoking status: Former Smoker    Quit date: 11/10/2012  . Smokeless tobacco: Never Used  . Alcohol use 0.5 oz/week    1 Standard drinks or equivalent per week     Comment: smokes 2-3 joints a day, last drink was in May 2014    Allergies:  Allergies  Allergen Reactions  . Latex Rash    Prescriptions Prior to Admission  Medication Sig Dispense Refill Last Dose  . famotidine (PEPCID) 20 MG tablet Take 1 tablet (20 mg total) by mouth daily. 5 tablet 0   . predniSONE (STERAPRED UNI-PAK 21 TAB) 10 MG (21) TBPK tablet Tuesday, August 1:  5 tablets at one time in the morning Wednesday, August 2:  4 tablets at one time in the morning Thursday, August 3: 3 tablets at one time in the morning Friday,  August 4:  2 tablets at one time in the morning Saturday, August 5:  1 tablet at one time in the morning 15 tablet 0     Review of Systems  Constitutional: Negative.   Gastrointestinal: Positive for abdominal pain and constipation. Negative for diarrhea, nausea and vomiting.  Genitourinary: Negative.    Physical Exam   Blood pressure 108/61, pulse 72, temperature 98.5 F (36.9 C), temperature source Oral, resp. rate 16, last menstrual period 07/11/2015, unknown if currently breastfeeding.  Physical Exam  Nursing note and vitals reviewed. Constitutional: She is oriented to person, place, and time. She appears well-developed and well-nourished. No distress.  HENT:  Head: Normocephalic and atraumatic.  Eyes: Conjunctivae are normal. Right eye exhibits no discharge. Left eye exhibits no discharge. No scleral icterus.  Neck: Normal range of motion.  Respiratory: Effort normal. No respiratory distress.  GI: Soft. There is no tenderness.  Fundal height 21 cm  Neurological: She is alert and oriented to person, place, and time.  Skin: Skin is warm and dry. She is not diaphoretic.  Psychiatric: She has a normal mood and affect. Her behavior is normal. Judgment and thought content normal.   Dilation: Closed Exam by:: Judeth Horn NP  MAU Course  Procedures Results for orders placed or performed during the hospital encounter of 12/17/15 (from the past 24 hour(s))  Urinalysis, Routine w reflex microscopic (not at [facility])     Status: None   Collection Time: 12/17/15  2:15 PM  Result Value Ref Range   Color, Urine YELLOW YELLOW   APPearance CLEAR CLEAR   Specific Gravity, Urine 1.010 1.005 - 1.030   pH 7.5 5.0 - 8.0   Glucose, UA NEGATIVE NEGATIVE mg/dL   Hgb urine dipstick NEGATIVE NEGATIVE   Bilirubin Urine NEGATIVE NEGATIVE   Ketones, ur NEGATIVE NEGATIVE mg/dL   Protein, ur NEGATIVE NEGATIVE mg/dL   Nitrite NEGATIVE NEGATIVE   Leukocytes, UA NEGATIVE NEGATIVE    MDM FHT 140  by doppler Cervix closed Pt asymptomatic S/w Dr. Henderson CloudHorvath -- ok to discharge home  Assessment and Plan  A: 1. Abdominal cramping affecting pregnancy   2. Constipation during pregnancy in second trimester    P: Discharge home Rx colace Increase fiber & water intake [facility] Discussed reasons to return to MAU  Judeth HornErin Eliaz Fout 12/17/2015, 3:47 PM

## 2015-12-17 NOTE — Discharge Instructions (Signed)
Constipation, Adult Constipation is when a person has fewer than three bowel movements a week, has difficulty having a bowel movement, or has stools that are dry, hard, or larger than normal. As people grow older, constipation is more common. A low-fiber diet, not taking in enough fluids, and taking certain medicines may make constipation worse.  CAUSES   Certain medicines, such as antidepressants, pain medicine, iron supplements, antacids, and water pills.   Certain diseases, such as diabetes, irritable bowel syndrome (IBS), thyroid disease, or depression.   Not drinking enough water.   Not eating enough fiber-rich foods.   Stress or travel.   Lack of physical activity or exercise.   Ignoring the urge to have a bowel movement.   Using laxatives too much.  SIGNS AND SYMPTOMS   Having fewer than three bowel movements a week.   Straining to have a bowel movement.   Having stools that are hard, dry, or larger than normal.   Feeling full or bloated.   Pain in the lower abdomen.   Not feeling relief after having a bowel movement.  DIAGNOSIS  Your health care provider will take a medical history and perform a physical exam. Further testing may be done for severe constipation. Some tests may include:  A barium enema X-ray to examine your rectum, colon, and, sometimes, your small intestine.   A sigmoidoscopy to examine your lower colon.   A colonoscopy to examine your entire colon. TREATMENT  Treatment will depend on the severity of your constipation and what is causing it. Some dietary treatments include drinking more fluids and eating more fiber-rich foods. Lifestyle treatments may include regular exercise. If these diet and lifestyle recommendations do not help, your health care provider may recommend taking over-the-counter laxative medicines to help you have bowel movements. Prescription medicines may be prescribed if over-the-counter medicines do not work.    HOME CARE INSTRUCTIONS   Eat foods that have a lot of fiber, such as fruits, vegetables, whole grains, and beans.  Limit foods high in fat and processed sugars, such as french fries, hamburgers, cookies, candies, and soda.   A fiber supplement may be added to your diet if you cannot get enough fiber from foods.   Drink enough fluids to keep your urine clear or pale yellow.   Exercise regularly or as directed by your health care provider.   Go to the restroom when you have the urge to go. Do not hold it.   Only take over-the-counter or prescription medicines as directed by your health care provider. Do not take other medicines for constipation without talking to your health care provider first.  SEEK IMMEDIATE MEDICAL CARE IF:   You have bright red blood in your stool.   Your constipation lasts for more than 4 days or gets worse.   You have abdominal or rectal pain.   You have thin, pencil-like stools.   You have unexplained weight loss. MAKE SURE YOU:   Understand these instructions.  Will watch your condition.  Will get help right away if you are not doing well or get worse.   This information is not intended to replace advice given to you by your health care provider. Make sure you discuss any questions you have with your health care provider.   Document Released: 11/29/2003 Document Revised: 03/23/2014 Document Reviewed: 12/12/2012 Elsevier Interactive Patient Education 2016 [employer]    Preterm Labor Information Preterm labor is when labor starts before you are [redacted] weeks pregnant. The normal  length of pregnancy is 39 to 41 weeks.  CAUSES  The cause of preterm labor is not often known. The most common known cause is infection. RISK FACTORS  Having a history of preterm labor.  Having your water break before it should.  Having a placenta that covers the opening of the cervix.  Having a placenta that breaks away from the uterus.  Having a cervix  that is too weak to hold the baby in the uterus.  Having too much fluid in the amniotic sac.  Taking drugs or smoking while pregnant.  Not gaining enough weight while pregnant.  Being younger than 58 and older than 28 years old.  Having a low income.  Being African American. SYMPTOMS  Period-like cramps, belly (abdominal) pain, or back pain.  Contractions that are regular, as often as six in an hour. They may be mild or painful.  Contractions that start at the top of the belly. They then move to the lower belly and back.  Lower belly pressure that seems to get stronger.  Bleeding from the vagina.  Fluid leaking from the vagina. TREATMENT  Treatment depends on:  Your condition.  The condition of your baby.  How many weeks pregnant you are. Your doctor may have you:  Take medicine to stop contractions.  Stay in bed except to use the restroom (bed rest).  Stay in the hospital. WHAT SHOULD YOU DO IF YOU THINK YOU ARE IN PRETERM LABOR? Call your doctor right away. You need to go to the hospital right away.  HOW CAN YOU PREVENT PRETERM LABOR IN FUTURE PREGNANCIES?  Stop smoking, if you smoke.  Maintain healthy weight gain.  Do not take drugs or be around chemicals that are not needed.  Tell your doctor if you think you have an infection.  Tell your doctor if you had a preterm labor before.   This information is not intended to replace advice given to you by your health care provider. Make sure you discuss any questions you have with your health care provider.   Document Released: 05/29/2008 Document Revised: 07/17/2014 Document Reviewed: 04/04/2012 Elsevier Interactive Patient Education [employer]

## 2015-12-17 NOTE — MAU Note (Signed)
Pt states she was cramping very bad around 1300 at work today, not as bad now.  Denies bleeding or discharge.

## 2015-12-25 LAB — OB RESULTS CONSOLE ABO/RH: RH Type: POSITIVE

## 2015-12-25 LAB — OB RESULTS CONSOLE ANTIBODY SCREEN: ANTIBODY SCREEN: NEGATIVE

## 2015-12-25 LAB — OB RESULTS CONSOLE GC/CHLAMYDIA
Chlamydia: NEGATIVE
Gonorrhea: NEGATIVE

## 2015-12-25 LAB — OB RESULTS CONSOLE HIV ANTIBODY (ROUTINE TESTING): HIV: NONREACTIVE

## 2015-12-25 LAB — OB RESULTS CONSOLE HEPATITIS B SURFACE ANTIGEN: Hepatitis B Surface Ag: NEGATIVE

## 2015-12-25 LAB — OB RESULTS CONSOLE RPR: RPR: NONREACTIVE

## 2015-12-25 LAB — OB RESULTS CONSOLE RUBELLA ANTIBODY, IGM: Rubella: IMMUNE

## 2016-03-06 ENCOUNTER — Encounter (HOSPITAL_COMMUNITY): Payer: Self-pay | Admitting: Obstetrics and Gynecology

## 2016-03-06 ENCOUNTER — Other Ambulatory Visit (HOSPITAL_COMMUNITY): Payer: Self-pay | Admitting: Obstetrics and Gynecology

## 2016-03-06 DIAGNOSIS — O26842 Uterine size-date discrepancy, second trimester: Secondary | ICD-10-CM

## 2016-03-10 ENCOUNTER — Ambulatory Visit (HOSPITAL_COMMUNITY)
Admission: RE | Admit: 2016-03-10 | Discharge: 2016-03-10 | Disposition: A | Discharge status: Home / Self Care | Payer: Medicaid Other | Source: Ambulatory Visit | Attending: Obstetrics and Gynecology | Admitting: Obstetrics and Gynecology

## 2016-03-10 ENCOUNTER — Other Ambulatory Visit (HOSPITAL_COMMUNITY): Payer: Self-pay | Admitting: Obstetrics and Gynecology

## 2016-03-10 ENCOUNTER — Encounter (HOSPITAL_COMMUNITY): Payer: Medicaid Other

## 2016-03-10 ENCOUNTER — Encounter (HOSPITAL_COMMUNITY): Payer: Self-pay

## 2016-03-10 DIAGNOSIS — O26843 Uterine size-date discrepancy, third trimester: Secondary | ICD-10-CM | POA: Diagnosis not present

## 2016-03-10 DIAGNOSIS — O26842 Uterine size-date discrepancy, second trimester: Secondary | ICD-10-CM

## 2016-03-10 DIAGNOSIS — Z3A34 34 weeks gestation of pregnancy: Secondary | ICD-10-CM | POA: Diagnosis not present

## 2016-03-10 DIAGNOSIS — O09293 Supervision of pregnancy with other poor reproductive or obstetric history, third trimester: Secondary | ICD-10-CM | POA: Insufficient documentation

## 2016-03-10 DIAGNOSIS — Z363 Encounter for antenatal screening for malformations: Secondary | ICD-10-CM

## 2016-03-16 NOTE — L&D Delivery Note (Signed)
Pt presented to the ER in active labor. On arrival she was 9cm. She had SROM with clear fluid. She completed the first stage rapidly and then had a SVD over an intact perineum. Placenta -S/I. EBL-400cc. Baby to NBN. Baby was delivered by Dr Ashok PallWouk.

## 2016-03-17 LAB — OB RESULTS CONSOLE GBS: STREP GROUP B AG: POSITIVE

## 2016-04-15 ENCOUNTER — Encounter (HOSPITAL_COMMUNITY): Payer: Self-pay | Admitting: *Deleted

## 2016-04-15 ENCOUNTER — Other Ambulatory Visit: Payer: Self-pay | Admitting: Obstetrics and Gynecology

## 2016-04-15 ENCOUNTER — Telehealth (HOSPITAL_COMMUNITY): Payer: Self-pay | Admitting: *Deleted

## 2016-04-15 NOTE — Telephone Encounter (Signed)
Preadmission screen  

## 2016-04-21 ENCOUNTER — Inpatient Hospital Stay (HOSPITAL_COMMUNITY)
Admission: AD | Admit: 2016-04-21 | Discharge: 2016-04-23 | DRG: 775 | Disposition: A | Discharge status: Home / Self Care | Payer: Medicaid Other | Source: Ambulatory Visit | Attending: Obstetrics and Gynecology | Admitting: Obstetrics and Gynecology

## 2016-04-21 ENCOUNTER — Encounter (HOSPITAL_COMMUNITY): Payer: Self-pay | Admitting: *Deleted

## 2016-04-21 DIAGNOSIS — Z3A40 40 weeks gestation of pregnancy: Secondary | ICD-10-CM

## 2016-04-21 DIAGNOSIS — D649 Anemia, unspecified: Secondary | ICD-10-CM | POA: Diagnosis present

## 2016-04-21 DIAGNOSIS — O99824 Streptococcus B carrier state complicating childbirth: Secondary | ICD-10-CM | POA: Diagnosis present

## 2016-04-21 DIAGNOSIS — Z87891 Personal history of nicotine dependence: Secondary | ICD-10-CM | POA: Diagnosis not present

## 2016-04-21 DIAGNOSIS — O365930 Maternal care for other known or suspected poor fetal growth, third trimester, not applicable or unspecified: Principal | ICD-10-CM | POA: Diagnosis present

## 2016-04-21 DIAGNOSIS — O9902 Anemia complicating childbirth: Secondary | ICD-10-CM | POA: Diagnosis present

## 2016-04-21 DIAGNOSIS — Z3490 Encounter for supervision of normal pregnancy, unspecified, unspecified trimester: Secondary | ICD-10-CM

## 2016-04-21 DIAGNOSIS — O4292 Full-term premature rupture of membranes, unspecified as to length of time between rupture and onset of labor: Secondary | ICD-10-CM | POA: Diagnosis present

## 2016-04-21 LAB — CBC
HCT: 32.2 % — ABNORMAL LOW (ref 36.0–46.0)
Hemoglobin: 11.2 g/dL — ABNORMAL LOW (ref 12.0–15.0)
MCH: 31.4 pg (ref 26.0–34.0)
MCHC: 34.8 g/dL (ref 30.0–36.0)
MCV: 90.2 fL (ref 78.0–100.0)
Platelets: 359 10*3/uL (ref 150–400)
RBC: 3.57 MIL/uL — AB (ref 3.87–5.11)
RDW: 13.9 % (ref 11.5–15.5)
WBC: 11.7 10*3/uL — ABNORMAL HIGH (ref 4.0–10.5)

## 2016-04-21 LAB — TYPE AND SCREEN
ABO/RH(D): O POS
Antibody Screen: NEGATIVE

## 2016-04-21 MED ORDER — SIMETHICONE 80 MG PO CHEW: 80.0000 mg | CHEWABLE_TABLET | ORAL | Status: DC | PRN

## 2016-04-21 MED ORDER — OXYCODONE-ACETAMINOPHEN 5-325 MG PO TABS: 2.0000 | ORAL_TABLET | ORAL | Status: DC | PRN

## 2016-04-21 MED ORDER — IBUPROFEN 600 MG PO TABS
600.0000 mg | ORAL_TABLET | Freq: Four times a day (QID) | ORAL | Status: DC
  Administered 2016-04-21 – 2016-04-23 (×7): 600 mg via ORAL
  Filled 2016-04-21 (×7): qty 1

## 2016-04-21 MED ORDER — DIBUCAINE 1 % RE OINT: 1.0000 "application " | TOPICAL_OINTMENT | RECTAL | Status: DC | PRN

## 2016-04-21 MED ORDER — LIDOCAINE HCL (PF) 1 % IJ SOLN
30.0000 mL | INTRAMUSCULAR | Status: DC | PRN
  Filled 2016-04-21: qty 30

## 2016-04-21 MED ORDER — COCONUT OIL OIL: 1.0000 "application " | TOPICAL_OIL | Status: DC | PRN

## 2016-04-21 MED ORDER — ONDANSETRON HCL 4 MG/2ML IJ SOLN: 4.0000 mg | INTRAMUSCULAR | Status: DC | PRN

## 2016-04-21 MED ORDER — OXYTOCIN 40 UNITS IN LACTATED RINGERS INFUSION - SIMPLE MED: 2.5000 [IU]/h | INTRAVENOUS | Status: DC

## 2016-04-21 MED ORDER — OXYTOCIN BOLUS FROM INFUSION
500.0000 mL | Freq: Once | INTRAVENOUS | Status: AC
  Administered 2016-04-21: 500 mL via INTRAVENOUS

## 2016-04-21 MED ORDER — SOD CITRATE-CITRIC ACID 500-334 MG/5ML PO SOLN: 30.0000 mL | ORAL | Status: DC | PRN

## 2016-04-21 MED ORDER — ONDANSETRON HCL 4 MG PO TABS: 4.0000 mg | ORAL_TABLET | ORAL | Status: DC | PRN

## 2016-04-21 MED ORDER — SENNOSIDES-DOCUSATE SODIUM 8.6-50 MG PO TABS
2.0000 | ORAL_TABLET | ORAL | Status: DC
  Administered 2016-04-21 – 2016-04-22 (×2): 2 via ORAL
  Filled 2016-04-21 (×2): qty 2

## 2016-04-21 MED ORDER — TETANUS-DIPHTH-ACELL PERTUSSIS 5-2.5-18.5 LF-MCG/0.5 IM SUSP: 0.5000 mL | Freq: Once | INTRAMUSCULAR | Status: DC

## 2016-04-21 MED ORDER — OXYTOCIN 40 UNITS IN LACTATED RINGERS INFUSION - SIMPLE MED
INTRAVENOUS | Status: AC
  Filled 2016-04-21: qty 1000

## 2016-04-21 MED ORDER — FLEET ENEMA 7-19 GM/118ML RE ENEM: 1.0000 | ENEMA | RECTAL | Status: DC | PRN

## 2016-04-21 MED ORDER — ONDANSETRON HCL 4 MG/2ML IJ SOLN: 4.0000 mg | Freq: Four times a day (QID) | INTRAMUSCULAR | Status: DC | PRN

## 2016-04-21 MED ORDER — AMPICILLIN SODIUM 2 G IJ SOLR
2.0000 g | Freq: Once | INTRAMUSCULAR | Status: DC
  Filled 2016-04-21: qty 2000

## 2016-04-21 MED ORDER — LACTATED RINGERS IV SOLN: 500.0000 mL | INTRAVENOUS | Status: DC | PRN

## 2016-04-21 MED ORDER — ZOLPIDEM TARTRATE 5 MG PO TABS: 5.0000 mg | ORAL_TABLET | Freq: Every evening | ORAL | Status: DC | PRN

## 2016-04-21 MED ORDER — LACTATED RINGERS IV SOLN
INTRAVENOUS | Status: DC
  Administered 2016-04-21: 09:00:00 via INTRAVENOUS

## 2016-04-21 MED ORDER — BENZOCAINE-MENTHOL 20-0.5 % EX AERO
1.0000 "application " | INHALATION_SPRAY | CUTANEOUS | Status: DC | PRN
  Filled 2016-04-21: qty 56

## 2016-04-21 MED ORDER — MEASLES, MUMPS & RUBELLA VAC ~~LOC~~ INJ
0.5000 mL | INJECTION | Freq: Once | SUBCUTANEOUS | Status: DC
  Filled 2016-04-21: qty 0.5

## 2016-04-21 MED ORDER — OXYCODONE-ACETAMINOPHEN 5-325 MG PO TABS: 1.0000 | ORAL_TABLET | ORAL | Status: DC | PRN

## 2016-04-21 MED ORDER — ACETAMINOPHEN 325 MG PO TABS
650.0000 mg | ORAL_TABLET | ORAL | Status: DC | PRN
  Administered 2016-04-21 – 2016-04-22 (×2): 650 mg via ORAL
  Filled 2016-04-21 (×2): qty 2

## 2016-04-21 MED ORDER — IBUPROFEN 600 MG PO TABS
600.0000 mg | ORAL_TABLET | Freq: Once | ORAL | Status: AC
  Administered 2016-04-21: 600 mg via ORAL
  Filled 2016-04-21: qty 1

## 2016-04-21 MED ORDER — ACETAMINOPHEN 325 MG PO TABS
650.0000 mg | ORAL_TABLET | ORAL | Status: DC | PRN
  Administered 2016-04-21: 650 mg via ORAL
  Filled 2016-04-21: qty 2

## 2016-04-21 MED ORDER — WITCH HAZEL-GLYCERIN EX PADS: 1.0000 "application " | MEDICATED_PAD | CUTANEOUS | Status: DC | PRN

## 2016-04-21 NOTE — Progress Notes (Signed)
I was called to stand by for delivery. Shortly after arrival patient crowned and began pushing. With one contraction infant delivered easily. OA. Placed on maternal abdomen. Patient's provider then arrived to assume care.

## 2016-04-21 NOTE — MAU Note (Signed)
Brought in by EMS; G6P3; c/o ucs since 0700; 3 cm yesterday;

## 2016-04-21 NOTE — H&P (Signed)
Pt is a 31 y/o black female [id number] at term who was broyght in by EMS in labor. On arrival she was 9 cm. She had SROM with clear fluid. She had late Initial PNC, starting at 24 wks. PNC was complicated by a smoking h.o. Of tobacco and marijuaina. She stopped both when she found out that she was preg. Preg was also complicated by iron def anemia. Pt  Had her membranes stripped yesterday. Baby was also on the small side for EFW. Pt states that she desires a BTL and has signed the papers.  PMHX: See hollister, Nl OGTT and + GBS PE: VSSAF        ABD- gravid, palp contractions        FHTs reactive IMP/ IUP at term in active labor         Late PNC         Small baby         Smoking hx prior to preg         Anemia         Desires PP BTL Plan/ Admit

## 2016-04-22 LAB — CBC
HEMATOCRIT: 27.3 % — AB (ref 36.0–46.0)
HEMOGLOBIN: 9.6 g/dL — AB (ref 12.0–15.0)
MCH: 31.6 pg (ref 26.0–34.0)
MCHC: 35.2 g/dL (ref 30.0–36.0)
MCV: 89.8 fL (ref 78.0–100.0)
Platelets: 290 10*3/uL (ref 150–400)
RBC: 3.04 MIL/uL — ABNORMAL LOW (ref 3.87–5.11)
RDW: 13.8 % (ref 11.5–15.5)
WBC: 11 10*3/uL — AB (ref 4.0–10.5)

## 2016-04-22 LAB — RPR: RPR: NONREACTIVE

## 2016-04-22 MED ORDER — LACTATED RINGERS IV SOLN
INTRAVENOUS | Status: DC
  Administered 2016-04-23: 20 mL/h via INTRAVENOUS

## 2016-04-22 MED ORDER — METOCLOPRAMIDE HCL 10 MG PO TABS
10.0000 mg | ORAL_TABLET | Freq: Once | ORAL | Status: AC
  Administered 2016-04-23: 10 mg via ORAL
  Filled 2016-04-22: qty 1

## 2016-04-22 MED ORDER — FAMOTIDINE 20 MG PO TABS
40.0000 mg | ORAL_TABLET | Freq: Once | ORAL | Status: AC
  Administered 2016-04-23: 40 mg via ORAL
  Filled 2016-04-22: qty 2

## 2016-04-22 NOTE — Progress Notes (Signed)
Post Partum Day 1 Subjective: no complaints, up ad lib, voiding, tolerating PO, + flatus and Desires permanent sterilization  Objective: Blood pressure 121/74, pulse 77, temperature 99.3 F (37.4 C), temperature source Oral, resp. rate 18, last menstrual period 07/11/2015, unknown if currently breastfeeding.  Physical Exam:  General: alert, cooperative and no distress Lochia: appropriate Uterine Fundus: firm U-2 perineum: healing well, no significant drainage, no dehiscence, no significant erythema DVT Evaluation: No evidence of DVT seen on physical exam. Negative Homan's sign. No cords or calf tenderness. No significant calf/ankle edema.   Recent Labs  04/21/16 0840 04/22/16 0546  HGB 11.2* 9.6*  HCT 32.2* 27.3*    Assessment/Plan: Plan for discharge tomorrow as baby is GBS pos and precip delivery Patient doesn't have epidural no IV access and uterus is already small, will try to  Do laparoscopic tubal tomorrow am instead of post partum tubal  IV access now NPO after midnight   LOS: 1 day   Bosco Paparella STACIA 04/22/2016, 8:03 AM

## 2016-04-23 ENCOUNTER — Inpatient Hospital Stay (HOSPITAL_COMMUNITY): Payer: Medicaid Other | Admitting: Anesthesiology

## 2016-04-23 ENCOUNTER — Encounter (HOSPITAL_COMMUNITY)
Admission: AD | Disposition: A | Discharge status: Home / Self Care | Payer: Self-pay | Source: Ambulatory Visit | Attending: Obstetrics and Gynecology

## 2016-04-23 ENCOUNTER — Encounter (HOSPITAL_COMMUNITY): Payer: Self-pay | Admitting: Anesthesiology

## 2016-04-23 SURGERY — LIGATION, FALLOPIAN TUBE, LAPAROSCOPIC
Anesthesia: General | Laterality: Bilateral

## 2016-04-23 MED ORDER — MIDAZOLAM HCL 2 MG/2ML IJ SOLN
INTRAMUSCULAR | Status: AC
  Filled 2016-04-23: qty 2

## 2016-04-23 MED ORDER — LIDOCAINE HCL (CARDIAC) 20 MG/ML IV SOLN
INTRAVENOUS | Status: AC
  Filled 2016-04-23: qty 5

## 2016-04-23 MED ORDER — IBUPROFEN 600 MG PO TABS: 600.0000 mg | ORAL_TABLET | Freq: Four times a day (QID) | ORAL | 0 refills | Status: DC

## 2016-04-23 MED ORDER — FENTANYL CITRATE (PF) 100 MCG/2ML IJ SOLN
INTRAMUSCULAR | Status: AC
  Filled 2016-04-23: qty 2

## 2016-04-23 MED ORDER — ONDANSETRON HCL 4 MG/2ML IJ SOLN
INTRAMUSCULAR | Status: AC
  Filled 2016-04-23: qty 2

## 2016-04-23 MED ORDER — DEXAMETHASONE SODIUM PHOSPHATE 4 MG/ML IJ SOLN
INTRAMUSCULAR | Status: AC
  Filled 2016-04-23: qty 1

## 2016-04-23 MED ORDER — MEDROXYPROGESTERONE ACETATE 150 MG/ML IM SUSP: 150.0000 mg | Freq: Once | INTRAMUSCULAR | Status: DC

## 2016-04-23 MED ORDER — ROCURONIUM BROMIDE 100 MG/10ML IV SOLN
INTRAVENOUS | Status: AC
  Filled 2016-04-23: qty 1

## 2016-04-23 MED ORDER — KETOROLAC TROMETHAMINE 30 MG/ML IJ SOLN
INTRAMUSCULAR | Status: AC
  Filled 2016-04-23: qty 1

## 2016-04-23 MED ORDER — PROPOFOL 10 MG/ML IV BOLUS
INTRAVENOUS | Status: AC
  Filled 2016-04-23: qty 20

## 2016-04-23 NOTE — Clinical Social Work Maternal (Signed)
CLINICAL SOCIAL WORK MATERNAL/CHILD NOTE  Patient Details  Name: [name] MRN: [id number] Date of Birth: 08/05/1985  Date:  04/23/2016  Clinical Social Worker Initiating Note:  Moriah Shawley, LCSW Date/ Time Initiated:  04/23/16/1015     Child's Name:  [name]   Legal Guardian:  Other (Comment) (Parents: [name] and [name])   Need for Interpreter:  None   Date of Referral:  04/22/16     Reason for Referral:  Current Substance Use/Substance Use During Pregnancy    Referral Source:  Central Nursery   Address:  [address] [place], [place] [address]  Phone number:  [phone]   Household Members:  Minor Children, Significant Other (Couple has a 3 year old son at home/[name].  MOB has two older sons from a previous relationship: [name]/7 and [name]/5.)   Natural Supports (not living in the home):  Immediate Family, Extended Family, Friends (Parents report that they have a good support system )   Professional Supports: None   Employment:     Type of Work: FOB works in [employer], MOB works for [employer], but did not answer question of what position she holds.   Education:      Financial Resources:  Medicaid   Other Resources:  WIC, Food Stamps    Cultural/Religious Considerations Which May Impact Care: None stated.  Strengths:  Ability to meet basic needs , Pediatrician chosen , Home prepared for child  (Pediatric follow up will be with [facility])   Risk Factors/Current Problems:  Substance Use    Cognitive State:  Alert    Mood/Affect:  Angry , Tearful    CSW Assessment: CSW received call from RN stating MOB was demanding to go now because she was not able to have her BTL due to RN reporting that a bag of marijuana was seen in MOB's room overnight.  MOB was outside of her room yelling and crying while speaking with hospital Security Director when CSW arrived.  CSW asked if MOB would go into her room and talk  with CSW.  MOB agreed and walked in to her room.  FOB was present and caring for infant.  MOB agreed to talk with FOB in the room. She was extremely tearful and states that she "hates this place," and informed CSW of her displeasure with her BTL being cancelled.  She states she wanted to get the procedure done today because she does not want any more children and she wants to be fully recovered before going back to work at 6 weeks.  MOB and FOB were adamant that they did not have marijuana in their room last night and are angry that no one confronted them with the concern at that time.  MOB states she has been smoking cigarettes throughout the night, but denies marijuana.  She states she has been extremely nervous about the surgery, has not had anything to eat because of the surgery and had already been given an IV in preparation for surgery.  CSW offered to ask the MD if MOB would be able to have a UDS and still consider doing the surgery.  MOB replied that she smoked marijuana last week and so a UDS will be positive.  CSW explained that CSW's understanding is that since she has marijuana in her system, anesthesia is contraindicated.  MOB was argumentative and states her friend had substances in her system and still had a BTL.  CSW cannot speak to this, but again stated   that the staff at [facility] made their decision in order to ensure MOB's health and safety.  D. Wear/AD arrived to speak to MOB and FOB.  CSW remained present.  MOB eventually calmed down, while still stating her frustrations.   CSW inquired about the couple's other children, baby supplies and support system.  FOB was pleasant throughout then entire conversation and appears supportive of MOB.  He and MOB shared responsibility of caring for infant while CSW was present and care seemed appropriate.  They report they have a great support system and everything they need for baby at home.  CSW provided education regarding SIDS precautions and  parents state awareness and commit to adhering to recommendations.   CSW discussed PMADs.  MOB denies hx after her other deliveries.  She was fairly attentive to information being given.   CSW inquired about MOB's marijuana use.  She reports that she does not think she has a problem and does not feel she is smoking to self medicate due to mental health concerns.  She states smoking helps with appetite.  She admits to smoking 1-2 times per day and that her last use was last week.  CSW informed her of hospital drug screen policy and mandated reporting.  Parents were understanding and did not state any concern with this.  MOB reports that she has been through this situation after her other deliveries as well.  Parents know that UDS is negative and that it will take a few days for CDS results to return.   CSW will monitor CDS results and make CPS report accordingly.     CSW Plan/Description:  No Further Intervention Required/No Barriers to Discharge, Patient/Family Education     Malaysha Arlen Elizabeth, LCSW 04/23/2016, 1:12 PM 

## 2016-04-23 NOTE — Progress Notes (Signed)
Psychosocial assessment completed.  CSW identifies no barriers to discharge.  Nursing staff aware. Full documentation to follow.

## 2016-04-23 NOTE — Progress Notes (Signed)
Received report from RN that bag of marajuana was seen in patients room during night.  Dr. Ardyth Manam notified  [facility] Father of baby in room throughout the night.  Patient called me into her room and stated that baby's MD said he heard there was "pot" in room.  Patient ensured that she was not going to be arrested.  OR called  2 hours early for patient to have BTL.Marland Kitchen. OR RN communicated that pot was seen in room during night.  BTL canceled.  Patient irate and removed IV.  She screamed she wanted discharge NOW !!!  Security in room AD and social worker in to see patient.  Patient calmer.  Discharge papers completed

## 2016-04-23 NOTE — Discharge Summary (Signed)
Obstetric Discharge Summary Reason for Admission: onset of labor Prenatal Procedures: none Intrapartum Procedures: spontaneous vaginal delivery Postpartum Procedures: none Complications-Operative and Postpartum: none Hemoglobin  Date Value Ref Range Status  04/22/2016 9.6 (L) 12.0 - 15.0 g/dL Final   HCT  Date Value Ref Range Status  04/22/2016 27.3 (L) 36.0 - 46.0 % Final    Physical Exam:  General: alert and no distress Lochia: appropriate Uterine Fundus: firm DVT Evaluation: No evidence of DVT seen on physical exam. Negative Homan's sign. No cords or calf tenderness.  Discharge Diagnoses: Term Pregnancy-delivered  Discharge Information: Date: 04/23/2016 Activity: pelvic rest Diet: routine Medications: PNV and Ibuprofen Condition: stable Instructions: refer to practice specific booklet Discharge to: home   Newborn Data: Live born female  Birth Weight: 6 lb 13.7 oz (3110 g) APGAR: 9, 9  Home with mother.  Essie HartINN, Valdis Bevill STACIA 04/23/2016, 10:10 AM

## 2016-04-23 NOTE — Anesthesia Preprocedure Evaluation (Signed)
Anesthesia Evaluation  Patient identified by MRN, date of birth, ID band Patient awake    Reviewed: Allergy & Precautions, H&P , NPO status , Patient's Chart, lab work & pertinent test results  Airway Mallampati: I  TM Distance: >3 FB Neck ROM: full    Dental no notable dental hx.    Pulmonary former smoker,    Pulmonary exam normal        Cardiovascular negative cardio ROS Normal cardiovascular exam     Neuro/Psych negative neurological ROS  negative psych ROS   GI/Hepatic negative GI ROS, Neg liver ROS,   Endo/Other  negative endocrine ROS  Renal/GU negative Renal ROS     Musculoskeletal   Abdominal Normal abdominal exam  (+)   Peds  Hematology   Anesthesia Other Findings   Reproductive/Obstetrics (+) Pregnancy                             Anesthesia Physical Anesthesia Plan  ASA: II  Anesthesia Plan: General   Post-op Pain Management:    Induction: Intravenous, Rapid sequence and Cricoid pressure planned  Airway Management Planned: Oral ETT  Additional Equipment:   Intra-op Plan:   Post-operative Plan: Extubation in OR  Informed Consent: I have reviewed the patients History and Physical, chart, labs and discussed the procedure including the risks, benefits and alternatives for the proposed anesthesia with the patient or authorized representative who has indicated his/her understanding and acceptance.   Dental advisory given  Plan Discussed with: CRNA and Surgeon  Anesthesia Plan Comments:         Anesthesia Quick Evaluation

## 2016-04-23 NOTE — Progress Notes (Addendum)
Patient ID: [name], female   DOB: 01/28/1986, 31 y.o.   MRN: [id number]   PREOPERATIVE NOTE  Patient desires permanent sterilization and I was unable to perform post partum tubal yesterday d/t scheduling,  Patient did not have IV access or an epidural.  On exam yesterday, her uterus was U-2 which would make a Post partum tubal ligation very difficult. Discussed with patient that we would attempt today to perform a  Laparoscopic tubal ligation with Filshie clips.   Discussed the risks of the procedure with the patient to include bleeding, infection, injury to organs (bowel,  Bladder, the uterus etc) and major vessels.  We also discussed the possibility of not completing the Sterilization procedure.  Patient is aware that this is a permanent procedure and there is a risk for regret  Later due to her young age.  Patient voiced understanding of these inherent risks, all inquiries made by patient were answered.   Consent signed, witnessed and placed into the chart  Plan: To OR for laparoscopic tubal ligation with Filshie clips under general anesthesia  [name] STACIA     Addendum: Surgery cancelled as marijuana was found in her room last night  [name] STACIA

## 2016-04-24 ENCOUNTER — Ambulatory Visit (HOSPITAL_COMMUNITY)
Admission: RE | Admit: 2016-04-24 | Payer: Medicaid Other | Source: Ambulatory Visit | Admitting: Obstetrics and Gynecology

## 2016-04-24 ENCOUNTER — Telehealth (HOSPITAL_COMMUNITY): Payer: Self-pay | Admitting: Lactation Services

## 2016-04-24 NOTE — Telephone Encounter (Signed)
Returned call to mom after [name]'s follow up call. Mom has questions about drying up milk. Baby now 183 days old. Mom does not want to breast feed- bottle feeding formula only. Reviewed ice to breasts, no heat, supportive bra. Hand express a little for comfort. No further questions.

## 2016-05-21 ENCOUNTER — Other Ambulatory Visit: Payer: Self-pay | Admitting: Obstetrics and Gynecology

## 2016-05-21 NOTE — Patient Instructions (Signed)
Your procedure is scheduled on:  Wednesday, May 27, 2016  [facility]  [facility]00 AM  Pick up the phone at the desk and dial [phone].  Call this number if you have problems the morning of surgery: [phone].  Remember: Do NOT eat food or drink after:  Midnight Tuesday  Take these medicines the morning of surgery with a SIP OF WATER:  None  Stop ALL herbal medications at this time  Do NOT smoke the day of surgery.  Do NOT wear jewelry (body piercing), metal hair clips/bobby pins, make-up, or nail polish. Do NOT wear lotions, powders, or perfumes.  You may wear deodorant. Do NOT shave for 48 hours prior to surgery. Do NOT bring valuables to the hospital. Contacts, dentures, or bridgework may not be worn into surgery.  Have a responsible adult drive you home and stay with you for 24 hours after your procedure  Bring a copy of your healthcare power of attorney and living will documents.  **Effective Friday, Jan. 12, 2018, [facility] will implement no hospital visitations from children age 29 and younger due to a steady increase in flu activity in our community and hospitals. **

## 2016-05-22 ENCOUNTER — Encounter (HOSPITAL_COMMUNITY)
Admission: RE | Admit: 2016-05-22 | Discharge: 2016-05-22 | Disposition: A | Discharge status: Home / Self Care | Payer: Medicaid Other | Source: Ambulatory Visit | Attending: Obstetrics and Gynecology | Admitting: Obstetrics and Gynecology

## 2016-05-22 ENCOUNTER — Encounter (HOSPITAL_COMMUNITY): Payer: Self-pay

## 2016-05-22 DIAGNOSIS — Z01812 Encounter for preprocedural laboratory examination: Secondary | ICD-10-CM | POA: Insufficient documentation

## 2016-05-22 HISTORY — DX: Gastro-esophageal reflux disease without esophagitis: K21.9

## 2016-05-22 LAB — CBC
HEMATOCRIT: 39.3 % (ref 36.0–46.0)
Hemoglobin: 12.8 g/dL (ref 12.0–15.0)
MCH: 30.5 pg (ref 26.0–34.0)
MCHC: 32.6 g/dL (ref 30.0–36.0)
MCV: 93.6 fL (ref 78.0–100.0)
PLATELETS: 290 10*3/uL (ref 150–400)
RBC: 4.2 MIL/uL (ref 3.87–5.11)
RDW: 13 % (ref 11.5–15.5)
WBC: 4.7 10*3/uL (ref 4.0–10.5)

## 2016-05-27 ENCOUNTER — Ambulatory Visit (HOSPITAL_COMMUNITY)
Admission: RE | Admit: 2016-05-27 | Discharge: 2016-05-27 | Disposition: A | Discharge status: Home / Self Care | Payer: Medicaid Other | Source: Ambulatory Visit | Attending: Obstetrics and Gynecology | Admitting: Obstetrics and Gynecology

## 2016-05-27 ENCOUNTER — Ambulatory Visit (HOSPITAL_COMMUNITY): Payer: Medicaid Other | Admitting: Anesthesiology

## 2016-05-27 ENCOUNTER — Encounter (HOSPITAL_COMMUNITY): Payer: Self-pay | Admitting: Anesthesiology

## 2016-05-27 ENCOUNTER — Encounter (HOSPITAL_COMMUNITY)
Admission: RE | Disposition: A | Discharge status: Home / Self Care | Payer: Self-pay | Source: Ambulatory Visit | Attending: Obstetrics and Gynecology

## 2016-05-27 DIAGNOSIS — Z302 Encounter for sterilization: Secondary | ICD-10-CM | POA: Diagnosis present

## 2016-05-27 DIAGNOSIS — D649 Anemia, unspecified: Secondary | ICD-10-CM | POA: Insufficient documentation

## 2016-05-27 DIAGNOSIS — F17210 Nicotine dependence, cigarettes, uncomplicated: Secondary | ICD-10-CM | POA: Insufficient documentation

## 2016-05-27 DIAGNOSIS — Z79899 Other long term (current) drug therapy: Secondary | ICD-10-CM | POA: Diagnosis not present

## 2016-05-27 HISTORY — PX: LAPAROSCOPIC TUBAL LIGATION: SHX1937

## 2016-05-27 LAB — PREGNANCY, URINE: PREG TEST UR: NEGATIVE

## 2016-05-27 SURGERY — LIGATION, FALLOPIAN TUBE, LAPAROSCOPIC
Anesthesia: General | Site: Abdomen | Laterality: Bilateral

## 2016-05-27 MED ORDER — PROPOFOL 10 MG/ML IV BOLUS
INTRAVENOUS | Status: DC | PRN
  Administered 2016-05-27: 150 mg via INTRAVENOUS

## 2016-05-27 MED ORDER — LIDOCAINE HCL (CARDIAC) 20 MG/ML IV SOLN
INTRAVENOUS | Status: DC | PRN
  Administered 2016-05-27: 80 mg via INTRAVENOUS

## 2016-05-27 MED ORDER — CEFAZOLIN SODIUM-DEXTROSE 2-4 GM/100ML-% IV SOLN
2.0000 g | Freq: Once | INTRAVENOUS | Status: AC
  Administered 2016-05-27: 2 g via INTRAVENOUS

## 2016-05-27 MED ORDER — DEXAMETHASONE SODIUM PHOSPHATE 10 MG/ML IJ SOLN
INTRAMUSCULAR | Status: DC | PRN
  Administered 2016-05-27: 10 mg via INTRAVENOUS

## 2016-05-27 MED ORDER — ROCURONIUM BROMIDE 100 MG/10ML IV SOLN
INTRAVENOUS | Status: AC
  Filled 2016-05-27: qty 1

## 2016-05-27 MED ORDER — BUPIVACAINE HCL (PF) 0.25 % IJ SOLN
INTRAMUSCULAR | Status: DC | PRN
  Administered 2016-05-27: 5 mL

## 2016-05-27 MED ORDER — OXYCODONE-ACETAMINOPHEN 2.5-325 MG PO TABS: 1.0000 | ORAL_TABLET | ORAL | 0 refills | Status: DC | PRN

## 2016-05-27 MED ORDER — OXYCODONE HCL 5 MG PO TABS
5.0000 mg | ORAL_TABLET | Freq: Once | ORAL | Status: AC | PRN
  Administered 2016-05-27: 5 mg via ORAL

## 2016-05-27 MED ORDER — PROPOFOL 10 MG/ML IV BOLUS
INTRAVENOUS | Status: AC
  Filled 2016-05-27: qty 20

## 2016-05-27 MED ORDER — SCOPOLAMINE 1 MG/3DAYS TD PT72
MEDICATED_PATCH | TRANSDERMAL | Status: AC
  Administered 2016-05-27: 1.5 mg via TRANSDERMAL
  Filled 2016-05-27: qty 1

## 2016-05-27 MED ORDER — ROCURONIUM BROMIDE 100 MG/10ML IV SOLN
INTRAVENOUS | Status: DC | PRN
  Administered 2016-05-27: 40 mg via INTRAVENOUS

## 2016-05-27 MED ORDER — HYDROMORPHONE HCL 1 MG/ML IJ SOLN: 0.2500 mg | INTRAMUSCULAR | Status: DC | PRN

## 2016-05-27 MED ORDER — ONDANSETRON HCL 4 MG/2ML IJ SOLN
INTRAMUSCULAR | Status: DC | PRN
  Administered 2016-05-27: 4 mg via INTRAVENOUS

## 2016-05-27 MED ORDER — SUGAMMADEX SODIUM 200 MG/2ML IV SOLN
INTRAVENOUS | Status: AC
  Filled 2016-05-27: qty 2

## 2016-05-27 MED ORDER — OXYCODONE HCL 5 MG PO TABS
ORAL_TABLET | ORAL | Status: AC
  Filled 2016-05-27: qty 1

## 2016-05-27 MED ORDER — LACTATED RINGERS IV SOLN
INTRAVENOUS | Status: DC
  Administered 2016-05-27 (×2): via INTRAVENOUS

## 2016-05-27 MED ORDER — FENTANYL CITRATE (PF) 100 MCG/2ML IJ SOLN
INTRAMUSCULAR | Status: DC | PRN
  Administered 2016-05-27 (×4): 50 ug via INTRAVENOUS

## 2016-05-27 MED ORDER — BUPIVACAINE HCL (PF) 0.25 % IJ SOLN
INTRAMUSCULAR | Status: AC
Start: 2016-05-27 — End: 2016-05-27
  Filled 2016-05-27: qty 30

## 2016-05-27 MED ORDER — MIDAZOLAM HCL 2 MG/2ML IJ SOLN
INTRAMUSCULAR | Status: AC
  Filled 2016-05-27: qty 2

## 2016-05-27 MED ORDER — MIDAZOLAM HCL 2 MG/2ML IJ SOLN
INTRAMUSCULAR | Status: DC | PRN
  Administered 2016-05-27: 1 mg via INTRAVENOUS

## 2016-05-27 MED ORDER — SUGAMMADEX SODIUM 200 MG/2ML IV SOLN
INTRAVENOUS | Status: DC | PRN
  Administered 2016-05-27: 105.2 mg via INTRAVENOUS

## 2016-05-27 MED ORDER — ONDANSETRON HCL 4 MG/2ML IJ SOLN
INTRAMUSCULAR | Status: AC
  Filled 2016-05-27: qty 2

## 2016-05-27 MED ORDER — OXYCODONE HCL 5 MG/5ML PO SOLN: 5.0000 mg | Freq: Once | ORAL | Status: AC | PRN

## 2016-05-27 MED ORDER — FENTANYL CITRATE (PF) 250 MCG/5ML IJ SOLN
INTRAMUSCULAR | Status: AC
  Filled 2016-05-27: qty 5

## 2016-05-27 MED ORDER — HYDROCODONE-ACETAMINOPHEN 7.5-325 MG PO TABS: 1.0000 | ORAL_TABLET | Freq: Once | ORAL | Status: DC | PRN

## 2016-05-27 MED ORDER — KETOROLAC TROMETHAMINE 30 MG/ML IJ SOLN
INTRAMUSCULAR | Status: DC | PRN
  Administered 2016-05-27: 15 mg via INTRAVENOUS

## 2016-05-27 MED ORDER — KETOROLAC TROMETHAMINE 30 MG/ML IJ SOLN
INTRAMUSCULAR | Status: AC
  Filled 2016-05-27: qty 1

## 2016-05-27 MED ORDER — DEXAMETHASONE SODIUM PHOSPHATE 10 MG/ML IJ SOLN
INTRAMUSCULAR | Status: AC
Start: 2016-05-27 — End: 2016-05-27
  Filled 2016-05-27: qty 1

## 2016-05-27 MED ORDER — SCOPOLAMINE 1 MG/3DAYS TD PT72
1.0000 | MEDICATED_PATCH | Freq: Once | TRANSDERMAL | Status: DC
  Administered 2016-05-27: 1.5 mg via TRANSDERMAL

## 2016-05-27 MED ORDER — ONDANSETRON HCL 4 MG/2ML IJ SOLN: 4.0000 mg | Freq: Four times a day (QID) | INTRAMUSCULAR | Status: DC | PRN

## 2016-05-27 SURGICAL SUPPLY — 24 items
CATH ROBINSON RED A/P 16FR (CATHETERS) ×3 IMPLANT
CLIP FILSHIE TUBAL LIGA STRL (Clip) ×6 IMPLANT
CLOTH BEACON ORANGE TIMEOUT ST (SAFETY) ×3 IMPLANT
DERMABOND ADVANCED (GAUZE/BANDAGES/DRESSINGS) ×2
DERMABOND ADVANCED .7 DNX12 (GAUZE/BANDAGES/DRESSINGS) ×1 IMPLANT
DRSG OPSITE POSTOP 3X4 (GAUZE/BANDAGES/DRESSINGS) ×3 IMPLANT
GLOVE BIOGEL PI IND STRL 7.0 (GLOVE) ×1 IMPLANT
GLOVE BIOGEL PI INDICATOR 7.0 (GLOVE) ×2
GLOVE ECLIPSE 7.0 STRL STRAW (GLOVE) ×6 IMPLANT
GOWN STRL REUS W/TWL LRG LVL3 (GOWN DISPOSABLE) ×6 IMPLANT
PACK LAPAROSCOPY BASIN (CUSTOM PROCEDURE TRAY) ×3 IMPLANT
PACK TRENDGUARD 450 HYBRID PRO (MISCELLANEOUS) IMPLANT
PACK TRENDGUARD 600 HYBRD PROC (MISCELLANEOUS) IMPLANT
PROTECTOR NERVE ULNAR (MISCELLANEOUS) ×6 IMPLANT
SLEEVE XCEL OPT CAN 5 100 (ENDOMECHANICALS) ×3 IMPLANT
SUT VICRYL 0 UR6 27IN ABS (SUTURE) ×3 IMPLANT
SUT VICRYL RAPIDE 4/0 PS 2 (SUTURE) ×3 IMPLANT
TOWEL OR 17X24 6PK STRL BLUE (TOWEL DISPOSABLE) ×6 IMPLANT
TRENDGUARD 450 HYBRID PRO PACK (MISCELLANEOUS)
TRENDGUARD 600 HYBRID PROC PK (MISCELLANEOUS)
TROCAR BALLN 12MMX100 BLUNT (TROCAR) ×3 IMPLANT
TROCAR XCEL NON-BLD 5MMX100MML (ENDOMECHANICALS) ×3 IMPLANT
WARMER LAPAROSCOPE (MISCELLANEOUS) ×3 IMPLANT
WATER STERILE IRR 1000ML POUR (IV SOLUTION) ×3 IMPLANT
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
## 2016-05-27 NOTE — Anesthesia Procedure Notes (Signed)
Procedure Name: Intubation Date/Time: 05/27/2016 11:44 AM Performed by: Georgeanne Nim Pre-anesthesia Checklist: Patient identified, Emergency Drugs available, Suction available, Patient being monitored and Timeout performed Patient Re-evaluated:Patient Re-evaluated prior to inductionOxygen Delivery Method: Circle system utilized Preoxygenation: Pre-oxygenation with 100% oxygen Intubation Type: IV induction Ventilation: Mask ventilation without difficulty Laryngoscope Size: Mac and 3 Tube size: 7.0 mm Number of attempts: 1 Airway Equipment and Method: Stylet Placement Confirmation: ETT inserted through vocal cords under direct vision,  positive ETCO2,  CO2 detector and breath sounds checked- equal and bilateral Secured at: 19 cm Tube secured with: Tape Dental Injury: Teeth and Oropharynx as per pre-operative assessment

## 2016-05-27 NOTE — Discharge Instructions (Signed)
Laparoscopic Tubal Ligation, Care After Refer to this sheet in the next few weeks. These instructions provide you with information about caring for yourself after your procedure. Your health care provider may also give you more specific instructions. Your treatment has been planned according to current medical practices, but problems sometimes occur. Call your health care provider if you have any problems or questions after your procedure. What can I expect after the procedure? After the procedure, it is common to have:  A sore throat.  Discomfort in your shoulder.  Mild discomfort or cramping in your abdomen.  Gas pains.  Pain or soreness in the area where the surgical cut (incision) was made.  A bloated feeling.  Tiredness.  Nausea.  Vomiting. Follow these instructions at home: Medicines   Take over-the-counter and prescription medicines only as told by your health care provider.  Do not take aspirin because it can cause bleeding.  Do not drive or operate heavy machinery while taking prescription pain medicine. Activity   Rest for the rest of the day.  Return to your normal activities as told by your health care provider. Ask your health care provider what activities are safe for you. Incision care    Follow instructions from your health care provider about how to take care of your incision. Make sure you:  Wash your hands with soap and water before you change your bandage (dressing). If soap and water are not available, use hand sanitizer.  Change your dressing as told by your health care provider.  Leave stitches (sutures) in place. They may need to stay in place for 2 weeks or longer.  Check your incision area every day for signs of infection. Check for:  More redness, swelling, or pain.  More fluid or blood.  Warmth.  Pus or a bad smell. Other Instructions   Do not take baths, swim, or use a hot tub until your health care provider approves. You may  take showers.  Keep all follow-up visits as told by your health care provider. This is important.  Have someone help you with your daily household tasks for the first few days. Contact a health care provider if:  You have more redness, swelling, or pain around your incision.  Your incision feels warm to the touch.  You have pus or a bad smell coming from your incision.  The edges of your incision break open after the sutures have been removed.  Your pain does not improve after 2-3 days.  You have a rash.  You repeatedly become dizzy or light-headed.  Your pain medicine is not helping.  You are constipated. Get help right away if:  You have a fever.  You faint.  You have increasing pain in your abdomen.  You have severe pain in one or both of your shoulders.  You have fluid or blood coming from your sutures or from your vagina.  You have shortness of breath or difficulty breathing.  You have chest pain or leg pain.  You have ongoing nausea, vomiting, or diarrhea. This information is not intended to replace advice given to you by your health care provider. Make sure you discuss any questions you have with your health care provider. Document Released: 09/19/2004 Document Revised: 08/05/2015 Document Reviewed: 02/10/2015 Elsevier Interactive Patient Education  2017 [employer]   General Anesthesia, Adult, Care After These instructions provide you with information about caring for yourself after your procedure. Your health care provider may also give you more specific instructions. Your  treatment has been planned according to current medical practices, but problems sometimes occur. Call your health care provider if you have any problems or questions after your procedure. What can I expect after the procedure? After the procedure, it is common to have:  Vomiting.  A sore throat.  Mental slowness. It is common to feel:  Nauseous.  Cold or  shivery.  Sleepy.  Tired.  Sore or achy, even in parts of your body where you did not have surgery. Follow these instructions at home: For at least 24 hours after the procedure:   Do not:  Participate in activities where you could fall or become injured.  Drive.  Use heavy machinery.  Drink alcohol.  Take sleeping pills or medicines that cause drowsiness.  Make important decisions or sign legal documents.  Take care of children on your own.  Rest. Eating and drinking   If you vomit, drink water, juice, or soup when you can drink without vomiting.  Drink enough fluid to keep your urine clear or pale yellow.  Make sure you have little or no nausea before eating solid foods.  Follow the diet recommended by your health care provider.

## 2016-05-27 NOTE — OR Nursing (Signed)
Pt's prescription for percocet found in PACU after pt Dc'd home.  Pt contacted per phone to make arrangements to get prescription to her.  Pt states that she prefers to treat pain with over the counter medications due to not liking how narcotics make her feel.  Does not wish to come and get it.   Will shred prescription.

## 2016-05-27 NOTE — Transfer of Care (Signed)
Immediate Anesthesia Transfer of Care Note  Patient: [name]  Procedure(s) Performed: Procedure(s) with comments: LAPAROSCOPIC TUBAL LIGATION WITH FILSHIE CLIPS (Bilateral) - WITH FILSHIE CLIPS  Patient Location: PACU  Anesthesia Type:General  Level of Consciousness: awake and patient cooperative  Airway & Oxygen Therapy: Patient Spontanous Breathing and Patient connected to nasal cannula oxygen  Post-op Assessment: Report given to RN and Post -op Vital signs reviewed and stable  Post vital signs: Reviewed and stable  Last Vitals:  Vitals:   05/27/16 1230 05/27/16 1235  BP:    Pulse:  (P) 65  Resp: (P) 20 (P) 15  Temp:  (P) 36.7 C    Last Pain:  Vitals:   05/27/16 1018  TempSrc: Oral  PainSc: 0-No pain      Patients Stated Pain Goal: 3 (05/27/16 1018)  Complications: No apparent anesthesia complications

## 2016-05-27 NOTE — Anesthesia Postprocedure Evaluation (Signed)
Anesthesia Post Note  Patient: Claris GowerYolanda M Picado  Procedure(s) Performed: Procedure(s) (LRB): LAPAROSCOPIC TUBAL LIGATION WITH FILSHIE CLIPS (Bilateral)  Patient location during evaluation: PACU Anesthesia Type: General Level of consciousness: awake and alert and oriented Pain management: pain level controlled Vital Signs Assessment: post-procedure vital signs reviewed and stable Respiratory status: spontaneous breathing, nonlabored ventilation and respiratory function stable Cardiovascular status: blood pressure returned to baseline and stable Postop Assessment: no signs of nausea or vomiting Anesthetic complications: no        Last Vitals:  Vitals:   05/27/16 1235 05/27/16 1245  BP: 102/70 112/71  Pulse: 65 65  Resp: 15 12  Temp: 36.7 C     Last Pain:  Vitals:   05/27/16 1235  TempSrc:   PainSc: 0-No pain   Pain Goal: Patients Stated Pain Goal: 3 (05/27/16 1018)               Manila Rommel A.

## 2016-05-27 NOTE — Anesthesia Preprocedure Evaluation (Signed)
Anesthesia Evaluation  Patient identified by MRN, date of birth, ID band Patient awake    Reviewed: Allergy & Precautions, H&P , NPO status , Patient's Chart, lab work & pertinent test results  Airway Mallampati: II   Neck ROM: full    Dental   Pulmonary asthma , Current Smoker,    breath sounds clear to auscultation       Cardiovascular negative cardio ROS   Rhythm:regular Rate:Normal     Neuro/Psych    GI/Hepatic GERD  ,  Endo/Other    Renal/GU      Musculoskeletal   Abdominal   Peds  Hematology   Anesthesia Other Findings   Reproductive/Obstetrics                             Anesthesia Physical Anesthesia Plan  ASA: II  Anesthesia Plan: General   Post-op Pain Management:    Induction: Intravenous  Airway Management Planned: Oral ETT  Additional Equipment:   Intra-op Plan:   Post-operative Plan: Extubation in OR  Informed Consent: I have reviewed the patients History and Physical, chart, labs and discussed the procedure including the risks, benefits and alternatives for the proposed anesthesia with the patient or authorized representative who has indicated his/her understanding and acceptance.     Plan Discussed with: CRNA, Anesthesiologist and Surgeon  Anesthesia Plan Comments:         Anesthesia Quick Evaluation

## 2016-05-28 ENCOUNTER — Encounter (HOSPITAL_COMMUNITY): Payer: Self-pay | Admitting: Obstetrics and Gynecology

## 2016-05-29 NOTE — Op Note (Signed)
NAMMarland Kitchen:  Tanya CardinalROGERS, Tanya              ACCOUNT NO.:  [id number]  MEDICAL RECORD NO.:  [id number]  LOCATION:                                 FACILITY:  PHYSICIAN:  Malva LimesMark Anderson, M.D.         DATE OF BIRTH:  DATE OF PROCEDURE:  05/27/2016 DATE OF DISCHARGE:                              OPERATIVE REPORT   PREOPERATIVE DIAGNOSIS:  The patient desires permanent sterilization.  POSTOPERATIVE DIAGNOSIS:  The patient desires permanent sterilization.  PROCEDURE:  Laparoscopic tubal ligation with Filshie clips.  SURGEON:  Malva LimesMark Anderson, M.D.  ANESTHESIA:  General and local.  ANTIBIOTICS:  Ancef 2 g.  DRAINS:  Foley catheter bladder.  SPECIMENS:  None.  FINDINGS:  The patient had normal fallopian tubes, ovaries and pelvis.  PROCEDURE IN DETAIL:  The patient was taken to the operating room.  A general anesthetic was administered without difficulty.  She was then prepped and draped in the usual fashion for this procedure.  She was placed in a dorsal lithotomy position.  Her bladder was drained with a __________Foley catheter and Hulka tenaculum applied to the anterior cervical lip.  Her umbilicus was injected with 0.25% Marcaine.  A 2 cm incision at the infraumbilical incision was made.  The fascia was grasped and entered sharp.  Parietal peritoneum was entered bluntly. The Hasson cannula was placed into the abdominal cavity and 3 L of carbon dioxide was insufflated.  At this point, the patient was placed in Trendelenburg and examination of the pelvic contents was undertaken. The Filshie clip applicator was then loaded __________the staff.  The left fallopian tube was then visualized, that was carried to its fimbriated end for identification.  A Filshie clip was applied at the isthmic portion, perpendicular to the tube.  The entire tube appeared to be within the clasp.  The clasp appeared to be tightly closed.  A similar procedure was performed on the opposite side.  This procedure was  concluded.  The instruments were removed.  The pneumoperitoneum released.  The fascia was closed with 0 Vicryl suture in a running fashion.  The skin was closed with Dermabond.  She was taken to the room in stable condition. Instrument and lap counts were correct x2.    ______________________________ Malva LimesMark Anderson, M.D.   ______________________________ Malva LimesMark Anderson, M.D.    MA/MEDQ  D:  05/27/2016  T:  05/27/2016  Job:  [id number]

## 2016-08-19 ENCOUNTER — Encounter (HOSPITAL_COMMUNITY): Payer: Self-pay

## 2016-08-19 ENCOUNTER — Emergency Department (HOSPITAL_COMMUNITY)
Admission: EM | Admit: 2016-08-19 | Discharge: 2016-08-19 | Disposition: A | Discharge status: Home / Self Care | Payer: Medicaid Other | Attending: Emergency Medicine | Admitting: Emergency Medicine

## 2016-08-19 DIAGNOSIS — F17210 Nicotine dependence, cigarettes, uncomplicated: Secondary | ICD-10-CM | POA: Diagnosis not present

## 2016-08-19 DIAGNOSIS — L84 Corns and callosities: Secondary | ICD-10-CM | POA: Diagnosis not present

## 2016-08-19 DIAGNOSIS — M7989 Other specified soft tissue disorders: Secondary | ICD-10-CM | POA: Diagnosis present

## 2016-08-19 DIAGNOSIS — Z91040 Latex allergy status: Secondary | ICD-10-CM | POA: Diagnosis not present

## 2016-08-19 DIAGNOSIS — J45909 Unspecified asthma, uncomplicated: Secondary | ICD-10-CM | POA: Diagnosis not present
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
## 2016-08-19 NOTE — Discharge Instructions (Signed)
Soak your feet several times a day, use pumice stone or a callus shaver. You can get other callus treatment over-the-counter from a pharmacy. Follow-up with podiatrist as needed.
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
## 2016-10-15 ENCOUNTER — Emergency Department (HOSPITAL_COMMUNITY)
Admission: EM | Admit: 2016-10-15 | Discharge: 2016-10-15 | Disposition: A | Discharge status: Home / Self Care | Payer: Medicaid Other | Attending: Emergency Medicine | Admitting: Emergency Medicine

## 2016-10-15 ENCOUNTER — Encounter (HOSPITAL_COMMUNITY): Payer: Self-pay

## 2016-10-15 DIAGNOSIS — J45909 Unspecified asthma, uncomplicated: Secondary | ICD-10-CM | POA: Insufficient documentation

## 2016-10-15 DIAGNOSIS — T7840XA Allergy, unspecified, initial encounter: Secondary | ICD-10-CM

## 2016-10-15 DIAGNOSIS — R600 Localized edema: Secondary | ICD-10-CM | POA: Diagnosis present

## 2016-10-15 DIAGNOSIS — F17210 Nicotine dependence, cigarettes, uncomplicated: Secondary | ICD-10-CM | POA: Insufficient documentation

## 2016-10-15 LAB — CBC WITH DIFFERENTIAL/PLATELET
Basophils Absolute: 0 10*3/uL (ref 0.0–0.1)
Basophils Relative: 0 %
EOS ABS: 0.1 10*3/uL (ref 0.0–0.7)
EOS PCT: 1 %
HCT: 31.6 % — ABNORMAL LOW (ref 36.0–46.0)
Hemoglobin: 10.7 g/dL — ABNORMAL LOW (ref 12.0–15.0)
LYMPHS ABS: 2.3 10*3/uL (ref 0.7–4.0)
LYMPHS PCT: 51 %
MCH: 30.7 pg (ref 26.0–34.0)
MCHC: 33.9 g/dL (ref 30.0–36.0)
MCV: 90.8 fL (ref 78.0–100.0)
MONO ABS: 0.3 10*3/uL (ref 0.1–1.0)
MONOS PCT: 7 %
Neutro Abs: 1.8 10*3/uL (ref 1.7–7.7)
Neutrophils Relative %: 41 %
Platelets: 335 10*3/uL (ref 150–400)
RBC: 3.48 MIL/uL — ABNORMAL LOW (ref 3.87–5.11)
RDW: 13.1 % (ref 11.5–15.5)
WBC: 4.5 10*3/uL (ref 4.0–10.5)

## 2016-10-15 LAB — BASIC METABOLIC PANEL
Anion gap: 9 (ref 5–15)
BUN: 10 mg/dL (ref 6–20)
CO2: 23 mmol/L (ref 22–32)
CREATININE: 0.91 mg/dL (ref 0.44–1.00)
Calcium: 9.4 mg/dL (ref 8.9–10.3)
Chloride: 108 mmol/L (ref 101–111)
GFR calc Af Amer: >60 mL/min (ref >60)
GFR calc non Af Amer: >60 mL/min (ref >60)
GLUCOSE: 89 mg/dL (ref 65–99)
POTASSIUM: 3.9 mmol/L (ref 3.5–5.1)
Sodium: 140 mmol/L (ref 135–145)

## 2016-10-15 LAB — I-STAT BETA HCG BLOOD, ED (MC, WL, AP ONLY)

## 2016-10-15 MED ORDER — PREDNISONE 20 MG PO TABS
ORAL_TABLET | ORAL | 0 refills | Status: DC
Start: 2016-10-15 — End: 2016-10-30

## 2016-10-15 MED ORDER — SODIUM CHLORIDE 0.9 % IV BOLUS (SEPSIS)
1000.0000 mL | Freq: Once | INTRAVENOUS | Status: AC
  Administered 2016-10-15: 1000 mL via INTRAVENOUS

## 2016-10-15 MED ORDER — METHYLPREDNISOLONE SODIUM SUCC 125 MG IJ SOLR
125.0000 mg | Freq: Once | INTRAMUSCULAR | Status: AC
  Administered 2016-10-15: 125 mg via INTRAVENOUS
  Filled 2016-10-15: qty 2

## 2016-10-15 MED ORDER — FAMOTIDINE IN NACL 20-0.9 MG/50ML-% IV SOLN
20.0000 mg | Freq: Once | INTRAVENOUS | Status: AC
  Administered 2016-10-15: 20 mg via INTRAVENOUS
  Filled 2016-10-15: qty 50

## 2016-10-15 NOTE — Discharge Instructions (Signed)
Take prednisone as prescribed.   Take benadryl 50 mg every 6 hrs as needed for itchiness.   See your doctor  Avoid motrin for now  Return to ER if you have worse rash, lip swelling, trouble breathing, throat swelling.

## 2016-10-15 NOTE — ED Triage Notes (Addendum)
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 Pt reports taking ibuprofen yesterday, going to interniship and noting some lip swelling and throat "closing up," but she was given benadryl and it decreased. This morning, pt took another ibuprofen not aware that it could have caused to swelling and reports recurrent lip swelling and feeling of "throat closing up." Pt was given another benadryl today, but the symptoms have not fully subsided like yesterday. Pt is in no acute distress at this time. Airway intact. No wheezing noted. Swelling to the left lower lip. Upon assessment, no swelling noted to the throat.
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
## 2016-10-30 ENCOUNTER — Emergency Department (HOSPITAL_COMMUNITY)
Admission: EM | Admit: 2016-10-30 | Discharge: 2016-10-30 | Disposition: A | Discharge status: Home / Self Care | Payer: Medicaid Other | Attending: Emergency Medicine | Admitting: Emergency Medicine

## 2016-10-30 ENCOUNTER — Encounter (HOSPITAL_COMMUNITY): Payer: Self-pay | Admitting: *Deleted

## 2016-10-30 DIAGNOSIS — F17210 Nicotine dependence, cigarettes, uncomplicated: Secondary | ICD-10-CM | POA: Insufficient documentation

## 2016-10-30 DIAGNOSIS — T7840XA Allergy, unspecified, initial encounter: Secondary | ICD-10-CM | POA: Insufficient documentation

## 2016-10-30 DIAGNOSIS — Z91040 Latex allergy status: Secondary | ICD-10-CM | POA: Diagnosis not present

## 2016-10-30 DIAGNOSIS — L509 Urticaria, unspecified: Secondary | ICD-10-CM | POA: Diagnosis present

## 2016-10-30 MED ORDER — PREDNISONE 20 MG PO TABS: 40.0000 mg | ORAL_TABLET | Freq: Every day | ORAL | 0 refills | Status: DC

## 2016-10-30 MED ORDER — PREDNISONE 20 MG PO TABS
20.0000 mg | ORAL_TABLET | Freq: Once | ORAL | Status: AC
Start: 2016-10-30 — End: 2016-10-30
  Administered 2016-10-30: 20 mg via ORAL
  Filled 2016-10-30: qty 1

## 2016-10-30 MED ORDER — FAMOTIDINE 40 MG PO TABS: 40.0000 mg | ORAL_TABLET | Freq: Every day | ORAL | 0 refills | Status: DC

## 2016-10-30 MED ORDER — FAMOTIDINE 20 MG PO TABS
40.0000 mg | ORAL_TABLET | Freq: Once | ORAL | Status: AC
  Administered 2016-10-30: 40 mg via ORAL
  Filled 2016-10-30: qty 2
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
## 2016-10-30 NOTE — Discharge Instructions (Signed)
RETURN TO ER IF YOU HAVE ANY BREATHING PROBLEMS, SWELLING INSIDE YOUR MOUTH/THROAT, VOMITING, OR WORSENING SYMPTOMS.  1. TAKE PEPCID ONCE DAILY AND BENADRYL EVERY 6-8 HOURS AS NEEDED FOR THE NEXT 2-3 DAYS. 2. TAKE NEW STEROID PRESCRIPTION STARTING TOMORROW UNTIL FINISHED. DO NOT SKIP DOSES AND TAKE ALL MEDICATION UNTIL IT IS FINISHED.

## 2016-10-30 NOTE — ED Triage Notes (Signed)
Pt states woke up with hives all over her and upper lip swollen. She denies swelling feeling to lung or throat closing up. Voice clear.  Denies any trouble breathing.  Pt was seen here and treated for same 2 weeks ago.

## 2016-11-24 ENCOUNTER — Encounter (HOSPITAL_COMMUNITY): Payer: Self-pay | Admitting: *Deleted

## 2016-11-24 ENCOUNTER — Emergency Department (HOSPITAL_COMMUNITY): Payer: Medicaid Other

## 2016-11-24 ENCOUNTER — Emergency Department (HOSPITAL_COMMUNITY)
Admission: EM | Admit: 2016-11-24 | Discharge: 2016-11-24 | Disposition: A | Discharge status: Home / Self Care | Payer: Medicaid Other | Attending: Emergency Medicine | Admitting: Emergency Medicine

## 2016-11-24 DIAGNOSIS — L509 Urticaria, unspecified: Secondary | ICD-10-CM | POA: Diagnosis present

## 2016-11-24 DIAGNOSIS — M79651 Pain in right thigh: Secondary | ICD-10-CM | POA: Insufficient documentation

## 2016-11-24 DIAGNOSIS — M79606 Pain in leg, unspecified: Secondary | ICD-10-CM

## 2016-11-24 DIAGNOSIS — T7840XA Allergy, unspecified, initial encounter: Secondary | ICD-10-CM | POA: Insufficient documentation

## 2016-11-24 MED ORDER — PREDNISONE 10 MG PO TABS: 20.0000 mg | ORAL_TABLET | Freq: Every day | ORAL | 0 refills | Status: DC

## 2016-11-24 MED ORDER — DEXAMETHASONE SODIUM PHOSPHATE 10 MG/ML IJ SOLN
10.0000 mg | Freq: Once | INTRAMUSCULAR | Status: AC
  Administered 2016-11-24: 10 mg via INTRAMUSCULAR
  Filled 2016-11-24: qty 1

## 2016-11-24 MED ORDER — FAMOTIDINE 20 MG PO TABS: 20.0000 mg | ORAL_TABLET | Freq: Two times a day (BID) | ORAL | 0 refills | Status: DC

## 2016-11-24 MED FILL — predniSONE 10 MG TABS: 10 | 6 days supply | Qty: 12 | Fill #0

## 2016-11-24 MED FILL — FAMOTIDINE 20 MG TABLET: 20 | 7 days supply | Qty: 14 | Fill #0

## 2016-11-24 NOTE — ED Notes (Signed)
Pt to Xray.
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
## 2016-11-24 NOTE — Discharge Instructions (Signed)
Take the prednisone and Pepcid as directed. This will help with the allergy symptoms. He can also take the Benadryl as we discussed.  Follow-up: Wellness clinic for establishing primary care doctor. He may need to have allergy testing and they can provide you with information regarding that.  Return the emergency Department for any worsening hives, difficulty breathing, rash, chest pain, throat or tongue swelling or any other worsening or concerning symptoms.

## 2016-12-07 DIAGNOSIS — J45909 Unspecified asthma, uncomplicated: Secondary | ICD-10-CM | POA: Insufficient documentation

## 2016-12-07 DIAGNOSIS — K0889 Other specified disorders of teeth and supporting structures: Secondary | ICD-10-CM | POA: Diagnosis not present

## 2016-12-07 DIAGNOSIS — F17210 Nicotine dependence, cigarettes, uncomplicated: Secondary | ICD-10-CM | POA: Diagnosis not present

## 2016-12-07 DIAGNOSIS — Z79899 Other long term (current) drug therapy: Secondary | ICD-10-CM | POA: Insufficient documentation

## 2016-12-08 ENCOUNTER — Encounter (HOSPITAL_COMMUNITY): Payer: Self-pay

## 2016-12-08 ENCOUNTER — Emergency Department (HOSPITAL_COMMUNITY)
Admission: EM | Admit: 2016-12-08 | Discharge: 2016-12-08 | Disposition: A | Discharge status: Home / Self Care | Payer: Medicaid Other | Attending: Emergency Medicine | Admitting: Emergency Medicine

## 2016-12-08 DIAGNOSIS — K0889 Other specified disorders of teeth and supporting structures: Secondary | ICD-10-CM

## 2016-12-08 MED ORDER — OXYCODONE-ACETAMINOPHEN 5-325 MG PO TABS
ORAL_TABLET | ORAL | Status: AC
  Filled 2016-12-08: qty 1

## 2016-12-08 MED ORDER — PENICILLIN V POTASSIUM 500 MG PO TABS: 500.0000 mg | ORAL_TABLET | Freq: Four times a day (QID) | ORAL | 0 refills | Status: DC

## 2016-12-08 MED ORDER — OXYCODONE-ACETAMINOPHEN 5-325 MG PO TABS
1.0000 | ORAL_TABLET | ORAL | Status: DC | PRN
  Administered 2016-12-08: 1 via ORAL
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
## 2016-12-08 NOTE — ED Triage Notes (Signed)
Pt states she has had tooth pain for months; pt states pain is increasing and her dentist appointment is next week pt a&ox 4 on arrival

## 2016-12-10 ENCOUNTER — Inpatient Hospital Stay: Payer: Medicaid Other

## 2016-12-14 ENCOUNTER — Ambulatory Visit: Payer: Medicaid Other | Attending: Internal Medicine | Admitting: Physician Assistant

## 2016-12-14 VITALS — BP 102/69 | HR 81 | Temp 98.6°F | Ht 63.0 in | Wt 113.4 lb

## 2016-12-14 DIAGNOSIS — Z833 Family history of diabetes mellitus: Secondary | ICD-10-CM | POA: Diagnosis not present

## 2016-12-14 DIAGNOSIS — Z8249 Family history of ischemic heart disease and other diseases of the circulatory system: Secondary | ICD-10-CM | POA: Insufficient documentation

## 2016-12-14 DIAGNOSIS — T783XXA Angioneurotic edema, initial encounter: Secondary | ICD-10-CM

## 2016-12-14 DIAGNOSIS — K219 Gastro-esophageal reflux disease without esophagitis: Secondary | ICD-10-CM | POA: Insufficient documentation

## 2016-12-14 DIAGNOSIS — Z823 Family history of stroke: Secondary | ICD-10-CM | POA: Insufficient documentation

## 2016-12-14 DIAGNOSIS — Z9851 Tubal ligation status: Secondary | ICD-10-CM | POA: Diagnosis not present

## 2016-12-14 DIAGNOSIS — R21 Rash and other nonspecific skin eruption: Secondary | ICD-10-CM | POA: Diagnosis not present
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
## 2017-01-18 ENCOUNTER — Encounter: Payer: Self-pay | Admitting: Family Medicine

## 2017-01-18 ENCOUNTER — Ambulatory Visit: Payer: Medicaid Other | Admitting: Family Medicine

## 2017-01-18 ENCOUNTER — Ambulatory Visit: Payer: Medicaid Other | Attending: Family Medicine | Admitting: Family Medicine

## 2017-01-18 VITALS — BP 102/69 | HR 69 | Temp 98.8°F | Resp 18 | Ht 63.0 in | Wt 112.2 lb

## 2017-01-18 DIAGNOSIS — Z888 Allergy status to other drugs, medicaments and biological substances status: Secondary | ICD-10-CM | POA: Diagnosis present

## 2017-01-18 DIAGNOSIS — J309 Allergic rhinitis, unspecified: Secondary | ICD-10-CM | POA: Diagnosis not present

## 2017-01-18 DIAGNOSIS — Z09 Encounter for follow-up examination after completed treatment for conditions other than malignant neoplasm: Secondary | ICD-10-CM | POA: Diagnosis not present

## 2017-01-18 DIAGNOSIS — Z889 Allergy status to unspecified drugs, medicaments and biological substances status: Secondary | ICD-10-CM | POA: Diagnosis not present

## 2017-01-18 DIAGNOSIS — J302 Other seasonal allergic rhinitis: Secondary | ICD-10-CM | POA: Diagnosis not present

## 2017-01-18 DIAGNOSIS — Z79899 Other long term (current) drug therapy: Secondary | ICD-10-CM | POA: Insufficient documentation

## 2017-01-18 DIAGNOSIS — Z7952 Long term (current) use of systemic steroids: Secondary | ICD-10-CM | POA: Insufficient documentation

## 2017-01-18 MED ORDER — EPINEPHRINE 0.3 MG/0.3ML IJ SOAJ: 0.3000 mg | Freq: Once | INTRAMUSCULAR | 1 refills | Status: DC | PRN

## 2017-01-18 MED ORDER — FAMOTIDINE 20 MG PO TABS: 20.0000 mg | ORAL_TABLET | Freq: Every day | ORAL | 0 refills | Status: DC | PRN

## 2017-01-18 MED ORDER — FLUTICASONE PROPIONATE 50 MCG/ACT NA SUSP: 2.0000 | Freq: Every day | NASAL | 6 refills | Status: DC

## 2017-01-18 MED ORDER — DIPHENHYDRAMINE HCL 25 MG PO CAPS: 25.0000 mg | ORAL_CAPSULE | Freq: Four times a day (QID) | ORAL | 1 refills | Status: DC | PRN

## 2017-01-18 NOTE — Progress Notes (Signed)
Subjective:  Patient ID: [name], female    DOB: [birth year]  Age: 31 y.o. MRN: [id number]  CC: Establish Care   HPI [name] presents to establish care and for follow up. History of recent ED visit for allergic reaction. Symptoms included lip and tongue swelling, urticaria, and itching. She reports no symptoms since last office visit. She reports upcoming appointment with allergist in December.    Outpatient Medications Prior to Visit  Medication Sig Dispense Refill  . Ferrous Sulfate (IRON) 325 (65 Fe) MG TABS Take 1 tablet by mouth daily.    . Prenatal Vit-Fe Fumarate-FA (PRENATAL VITAMIN PO) Take 1 tablet by mouth daily.     . famotidine (PEPCID) 20 MG tablet Take 1 tablet (20 mg total) by mouth 2 (two) times daily. (Patient not taking: Reported on 12/14/2016) 14 tablet 0  . oxycodone-acetaminophen (PERCOCET) 2.5-325 MG tablet Take 1 tablet by mouth every 4 (four) hours as needed for pain. (Patient not taking: Reported on 12/14/2016) 30 tablet 0  . penicillin v potassium (VEETID) 500 MG tablet Take 1 tablet (500 mg total) by mouth 4 (four) times daily. (Patient not taking: Reported on 12/14/2016) 40 tablet 0  . predniSONE (DELTASONE) 10 MG tablet Take 2 tablets (20 mg total) by mouth daily. (Patient not taking: Reported on 12/14/2016) 12 tablet 0   No facility-administered medications prior to visit.     ROS Review of Systems  Constitutional: Negative.   HENT: Negative.   Eyes: Negative.   Respiratory: Negative.   Cardiovascular: Negative.   Skin: Negative.   Psychiatric/Behavioral: Negative.     Objective:  BP 102/69 (BP Location: Left Arm, Patient Position: Sitting, Cuff Size: Normal)   Pulse 69   Temp 98.8 F (37.1 C) (Oral)   Resp 18   Ht 5\' 3"  (1.6 m)   Wt 112 lb 3.2 oz (50.9 kg)   SpO2 100%   BMI 19.88 kg/m   BP/Weight 01/18/2017 12/14/2016 12/08/2016  Systolic BP 102 102 128  Diastolic BP 69 69 79  Wt. (Lbs) 112.2 113.4 110  BMI 19.88 20.09 19.49    Some encounter information is confidential and restricted. Go to Review Flowsheets activity to see all data.     Physical Exam  Constitutional: She appears well-developed and well-nourished.  HENT:  Head: Normocephalic and atraumatic.  Right Ear: External ear normal.  Left Ear: External ear normal.  Nose: Mucosal edema present.  Mouth/Throat: Oropharynx is clear and moist.  Eyes: Conjunctivae are normal. Pupils are equal, round, and reactive to light.  Cardiovascular: Normal rate, regular rhythm, normal heart sounds and intact distal pulses.  Pulmonary/Chest: Effort normal and breath sounds normal.  Skin: Skin is warm and dry.  Psychiatric: She has a normal mood and affect.  Nursing note and vitals reviewed.    Assessment & Plan:   1. Follow up Encourage cont.with allergist referral. Script for Epi Pen given, ED precautions discussed.  2. History of allergic reaction Encourage cont.with allergist referral. Script for Epi Pen given, ED precautions discussed. - famotidine (PEPCID) 20 MG tablet; Take 1 tablet (20 mg total) daily as needed by mouth.  Dispense: 30 tablet; Refill: 0 - EPINEPHrine 0.3 mg/0.3 mL IJ SOAJ injection; Inject 0.3 mLs (0.3 mg total) once as needed for up to 1 dose into the muscle.  Dispense: 1 Device; Refill: 1 - diphenhydrAMINE (BENADRYL) 25 mg capsule; Take 1 capsule (25 mg total) every 6 (six) hours as needed by mouth for itching or allergies.  Dispense: 30 capsule; Refill: 1  3. Seasonal allergic rhinitis, unspecified trigger  - fluticasone (FLONASE) 50 MCG/ACT nasal spray; Place 2 sprays daily into both nostrils.  Dispense: 16 g; Refill: 6      Follow-up: Return As needed.   Lizbeth BarkMandesia R Shandora Koogler FNP

## 2017-01-18 NOTE — Patient Instructions (Signed)
Allergies An allergy is when your body reacts to a substance in a way that is not normal. An allergic reaction can happen after you:  Eat something.  Breathe in something.  Touch something.  You can be allergic to:  Things that are only around during certain seasons, like molds and pollens.  Foods.  Drugs.  Insects.  Animal dander.  What are the signs or symptoms?  Puffiness (swelling). This may happen on the lips, face, tongue, mouth, or throat.  Sneezing.  Coughing.  Breathing loudly (wheezing).  Stuffy nose.  Tingling in the mouth.  A rash.  Itching.  Itchy, red, puffy areas of skin (hives).  Watery eyes.  Throwing up (vomiting).  Watery poop (diarrhea).  Dizziness.  Feeling faint or fainting.  Trouble breathing or swallowing.  A tight feeling in the chest.  A fast heartbeat. How is this diagnosed? Allergies can be diagnosed with:  A medical and family history.  Skin tests.  Blood tests.  A food diary. A food diary is a record of all the foods, drinks, and symptoms you have each day.  The results of an elimination diet. This diet involves making sure not to eat certain foods and then seeing what happens when you start eating them again.  How is this treated? There is no cure for allergies, but allergic reactions can be treated with medicine. Severe reactions usually need to be treated at a hospital. How is this prevented? The best way to prevent an allergic reaction is to avoid the thing you are allergic to. Allergy shots and medicines can also help prevent reactions in some cases. This information is not intended to replace advice given to you by your health care provider. Make sure you discuss any questions you have with your health care provider. Document Released: 06/27/2012 Document Revised: 10/28/2015 Document Reviewed: 12/12/2013 Elsevier Interactive Patient Education  2018 Elsevier Inc.  

## 2017-02-15 ENCOUNTER — Ambulatory Visit: Payer: Medicaid Other | Admitting: Allergy & Immunology

## 2017-03-25 ENCOUNTER — Ambulatory Visit (INDEPENDENT_AMBULATORY_CARE_PROVIDER_SITE_OTHER): Payer: Medicaid Other | Admitting: Allergy & Immunology

## 2017-03-25 ENCOUNTER — Encounter: Payer: Self-pay | Admitting: Allergy & Immunology

## 2017-03-25 VITALS — BP 112/72 | HR 76 | Temp 97.9°F | Resp 16 | Ht 63.0 in | Wt 111.0 lb

## 2017-03-25 DIAGNOSIS — J310 Chronic rhinitis: Secondary | ICD-10-CM | POA: Diagnosis not present

## 2017-03-25 DIAGNOSIS — T783XXD Angioneurotic edema, subsequent encounter: Secondary | ICD-10-CM

## 2017-03-25 DIAGNOSIS — L508 Other urticaria: Secondary | ICD-10-CM | POA: Diagnosis not present

## 2017-03-25 MED ORDER — CETIRIZINE HCL 10 MG PO TABS: 10.0000 mg | ORAL_TABLET | Freq: Every day | ORAL | 5 refills | Status: DC

## 2017-03-25 NOTE — Patient Instructions (Addendum)
1. Chronic urticaria with angioedema - Your history does not have any "red flags" such as fevers, joint pains, or permanent skin changes that would be concerning for a more serious cause of hives.  - Oftentimes, we never really find a cause of these. - I would avoid NSAIDs since these have triggered the symptoms in the past. - Take a good history of any foods that might trigger the reactions.  - We will get some labs to rule out serious causes of hives: complete blood count, tryptase level, chronic urticaria panel, CMP, ESR, and CRP. - We will also get an environmental allergy panel.    - In the meantime, start suppressive dosing of antihistamines:   - Morning: Zyrtec (cetirizine) 10-7m (one to two tablets)  - Evening: Zyrtec (cetirizine) 10-245m(one to two tablets)  - If the above is not working, try adding: Zantac (ranitidine) 30061mtwo tablets) - You can change this dosing at home, decreasing the dose as needed or increasing the dosing as needed.  - If you are not tolerating the medications or are tired of taking them every day, we can start treatment with a monthly injectable medication called Xolair.   2. Allergic rhinitis - We will get environmental blood work to look at the possibility of seasonal allergies. - In the meantime, start Singulair (montelukast) 39m28mily.  3. Return in about 3 months (around 06/23/2017).    Please inform us o[place]any Emergency Department visits, hospitalizations, or changes in symptoms. Call us b[place]oreaore going to the ED for breathing or allergy symptoms since we might be able to fit you in for a sick visit. Feel free to contact us a[place]atime with any questions, problems, or concerns.  It was a pleasure to meet you today! Happy New Year!   Websites that have reliable patient information: 1. American Academy of Asthma, Allergy, and Immunology: www.aaaai.org 2. Food Allergy Research and Education (FARE): foodallergy.org 3. Mothers of Asthmatics:  http://www.asthmacommunitynetwork.org 4. American College of Allergy, Asthma, and Immunology: www.acaai.org   Try filing a complaint with the [email or web address]

## 2017-03-25 NOTE — Progress Notes (Signed)
 NEW PATIENT  Date of Service/Encounter:  03/25/17  Referring provider: Hairston, Mandesia R, FNP   Assessment:   Chronic urticaria with angioedema  Seasonal rhinitis   Plan/Recommendations:   1. Chronic urticaria with angioedema - Your history does not have any "red flags" such as fevers, joint pains, or permanent skin changes that would be concerning for a more serious cause of hives.  - Oftentimes, we never really find a cause of these. - I would avoid NSAIDs since these have triggered the symptoms in the past. - Take a good history of any foods that might trigger the reactions.  - We will get some labs to rule out serious causes of hives: complete blood count, tryptase level, chronic urticaria panel, CMP, ESR, and CRP. - We will also get an environmental allergy panel.    - In the meantime, start suppressive dosing of antihistamines:   - Morning: Zyrtec (cetirizine) 10-20mg (one to two tablets)  - Evening: Zyrtec (cetirizine) 10-20mg (one to two tablets)  - If the above is not working, try adding: Zantac (ranitidine) 300mg (two tablets) - You can change this dosing at home, decreasing the dose as needed or increasing the dosing as needed.  - If you are not tolerating the medications or are tired of taking them every day, we can start treatment with a monthly injectable medication called Xolair.   2. Allergic rhinitis - We will get environmental blood work to look at the possibility of seasonal allergies since we are getting blood work anyway. - In the meantime, start Singulair (montelukast) 10mg daily. - The cetirizine recommended for the hives should also help with her allergic rhinitis symptoms.   3. Return in about 3 months (around 06/23/2017).  Subjective:   [name] is a 32 y.o. female presenting today for evaluation of  Chief Complaint  Patient presents with  . Angioedema    tongue and mouth. last occurrence was in September 2018.   . Urticaria   mostly on back, stomach, legs and thighs. says that sometimes she would get them on her arms.     Vernecia M Hayashi has a history of the following: Patient Active Problem List   Diagnosis Date Noted  . Normal labor 04/21/2016  . Pregnancy 04/21/2016  . IUGR (intrauterine growth restriction) 07/10/2013  . Normal spontaneous vaginal delivery 07/10/2013  . Active labor 04/27/2011  . Normal delivery 04/27/2011  . Pregnancy as incidental finding 09/21/2010    History obtained from: chart review and patient.  Mikayla M Vandervelden was referred by Hairston, Mandesia R, FNP.      [name] is a 32 y.o. female presenting for swelling and hives. Her history is all over the place because she is currently high (patient admitted as such). She reports that the episodes of swelling started when she was pregnant with her last son around 2015. She also had episodes of urticaria with these as well. She has no pictures today. Swelling seemed to typically involve her face. She would take Benadryl at home with improvement over the course of hours. She has been to the ED on multiple occasions, where she is typically given Benadryl as well as steroids. There is definitely a hormonal aspect to the symptoms, since they either resolve or get worse with pregnancies (she has had four pregnancies total). At one point she felt that NSAIDs were involved since they seemed to worsen after exposure. However there are other times when NSAIDs do not cause an outbreak. She has no   other systemic symptoms with these, including wheezing, abdominal pain, or vomiting. She notes no food triggers to her symptoms. She always has hives with the episodes of angioedema. There is no family history of swelling, although she did not know her mother's family much at all.   Haely also reports some seasonal allergy symptoms including itchy watery eyes and sneezing at certain times during the year. She does not take anything on a regular basis to help  these symptoms. She is unsure whether animals make her symptoms worse. She has never been allergy tested.  Otherwise, there is no history of other atopic diseases, including asthma, drug allergies, or stinging insect allergies. There is no significant infectious history. Vaccinations are up to date.    Past Medical History: Patient Active Problem List   Diagnosis Date Noted  . Normal labor 04/21/2016  . Pregnancy 04/21/2016  . IUGR (intrauterine growth restriction) 07/10/2013  . Normal spontaneous vaginal delivery 07/10/2013  . Active labor 04/27/2011  . Normal delivery 04/27/2011  . Pregnancy as incidental finding 09/21/2010    Medication List:  Allergies as of 03/25/2017      Reactions   Ibuprofen Swelling   Latex Rash      Medication List        Accurate as of 03/25/17  4:07 PM. Always use your most recent med list.          cetirizine 10 MG tablet Commonly known as:  ZYRTEC Take 1 tablet (10 mg total) by mouth daily.   hydrocortisone 2.5 % cream hydrocortisone 2.5 % topical cream  APPLY A THIN LAYER TO THE AFFECTED AREA(S) BY TOPICAL ROUTE 3 TIMES PER DAY   PRENATAL VITAMIN PO Take 1 tablet by mouth daily.       Birth History: non-contributory.   Developmental History: non-contributory.   Past Surgical History: Past Surgical History:  Procedure Laterality Date  . LAPAROSCOPIC TUBAL LIGATION Bilateral 05/27/2016   Procedure: LAPAROSCOPIC TUBAL LIGATION WITH FILSHIE CLIPS;  Surgeon: Mark E Anderson, MD;  Location: [facility];  Service: Gynecology;  Laterality: Bilateral;  WITH FILSHIE CLIPS  . NO PAST SURGERIES    . THERAPEUTIC ABORTION       Family History: Family History  Problem Relation Age of Onset  . Stroke Father   . Diabetes Maternal Grandmother   . Hypertension Maternal Grandmother   . Alcohol abuse Maternal Grandfather   . Kidney disease Maternal Grandfather   . Diabetes Paternal Grandmother      Social History: Tanaiya lives at home with  her children. She lives in an apartment with tile throughout the apartment. There are no animals inside or outside of the home. They have gas and electric heating and central cooling. 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 She did complete a degree in pharm tech with [employer], but was five hours short on her externship. However, [employer] has now closed and she is out the loans that she took out to go to school. She has not contacted the [employer] yet.      Review of Systems: a 14-point review of systems is pertinent for what is mentioned in HPI.  Otherwise, all other systems were negative. Constitutional: negative other than that listed in the HPI Eyes: negative other than that listed in the HPI Ears, nose, mouth, throat, and face: negative other than that listed in the HPI Respiratory: negative other than that listed in the HPI Cardiovascular: negative other   than that listed in the HPI Gastrointestinal: negative other than that listed in the HPI Genitourinary: negative other than that listed in the HPI Integument: negative other than that listed in the HPI Hematologic: negative other than that listed in the HPI Musculoskeletal: negative other than that listed in the HPI Neurological: negative other than that listed in the HPI Allergy/Immunologic: negative other than that listed in the HPI    Objective:   Blood pressure 112/72, pulse 76, temperature 97.9 F (36.6 C), temperature source Oral, resp. rate 16, height 5' 3" (1.6 m), weight 111 lb (50.3 kg), SpO2 99 %, unknown if currently breastfeeding. Body mass index is 19.66 kg/m.   Physical Exam:  General: Alert, interactive, in no acute distress. Talking on the phone when I first entered and took a while to get off the phone. Eyes: Conjunctival injection bilaterally with limbal sparing, no discharge on the right, no discharge on the left, no  Horner-Trantas dots present and allergic shiners present bilaterally. PERRL bilaterally. EOMI without pain. No photophobia.  Ears: Right TM pearly gray with normal light reflex, Left TM pearly gray with normal light reflex, Right TM intact without perforation and Left TM intact without perforation.  Nose/Throat: External nose within normal limits and septum midline. Turbinates edematous and pale without discharge. Posterior oropharynx mildly erythematous without cobblestoning in the posterior oropharynx. Tonsils 2+ without exudates.  Tongue without thrush. Neck: Supple without thyromegaly. Trachea midline. Adenopathy: no enlarged lymph nodes appreciated in the anterior cervical, occipital, axillary, epitrochlear, inguinal, or popliteal regions. Lungs: Clear to auscultation without wheezing, rhonchi or rales. No increased work of breathing. CV: Normal S1/S2. No murmurs. Capillary refill <2 seconds.  Abdomen: Nondistended, nontender. No guarding or rebound tenderness. Bowel sounds present in all fields and hypoactive  Skin: Warm and dry, without lesions or rashes. Extremities:  No clubbing, cyanosis or edema. Neuro:   Grossly intact. No focal deficits appreciated. Responsive to questions.  Diagnostic studies: none     Salvatore Marvel, MD [facility] [place] [facility]

## 2017-06-16 ENCOUNTER — Ambulatory Visit: Payer: Medicaid Other | Admitting: Nurse Practitioner

## 2017-11-02 DIAGNOSIS — Z114 Encounter for screening for human immunodeficiency virus [HIV]: Secondary | ICD-10-CM | POA: Diagnosis not present

## 2017-11-02 DIAGNOSIS — Z0389 Encounter for observation for other suspected diseases and conditions ruled out: Secondary | ICD-10-CM | POA: Diagnosis not present

## 2017-11-02 DIAGNOSIS — Z113 Encounter for screening for infections with a predominantly sexual mode of transmission: Secondary | ICD-10-CM | POA: Diagnosis not present

## 2017-11-02 DIAGNOSIS — Z3009 Encounter for other general counseling and advice on contraception: Secondary | ICD-10-CM | POA: Diagnosis not present

## 2017-11-02 DIAGNOSIS — Z1388 Encounter for screening for disorder due to exposure to contaminants: Secondary | ICD-10-CM | POA: Diagnosis not present
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
## 2019-05-03 ENCOUNTER — Ambulatory Visit (INDEPENDENT_AMBULATORY_CARE_PROVIDER_SITE_OTHER)
Admission: RE | Admit: 2019-05-03 | Discharge: 2019-05-03 | Disposition: A | Discharge status: Home / Self Care | Payer: Medicaid Other | Source: Ambulatory Visit

## 2019-05-03 ENCOUNTER — Ambulatory Visit
Admission: EM | Admit: 2019-05-03 | Discharge: 2019-05-03 | Disposition: A | Discharge status: Home / Self Care | Payer: Medicaid Other | Attending: Emergency Medicine | Admitting: Emergency Medicine

## 2019-05-03 DIAGNOSIS — Z8619 Personal history of other infectious and parasitic diseases: Secondary | ICD-10-CM | POA: Diagnosis not present

## 2019-05-03 DIAGNOSIS — N898 Other specified noninflammatory disorders of vagina: Secondary | ICD-10-CM

## 2019-05-03 DIAGNOSIS — Z7251 High risk heterosexual behavior: Secondary | ICD-10-CM | POA: Insufficient documentation

## 2019-05-03 DIAGNOSIS — Z113 Encounter for screening for infections with a predominantly sexual mode of transmission: Secondary | ICD-10-CM

## 2019-05-03 NOTE — Discharge Instructions (Addendum)
Recommend you come to one of her urgent care clinics to perform a cervical vaginal swab to check for STDs as this could be contributing to your symptoms. May continue Monistat every night x1 week for possible yeast infection. Return for persistent, worsening symptoms such as abdominal pain, vaginal pain

## 2019-05-03 NOTE — ED Triage Notes (Signed)
Pt states had a virtual visit today, just needs std testing. Pt c/o a green vaginal discharge x3 days.
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
## 2019-05-05 ENCOUNTER — Telehealth: Payer: Self-pay | Admitting: Emergency Medicine

## 2019-05-05 LAB — CERVICOVAGINAL ANCILLARY ONLY
Chlamydia: NEGATIVE
Neisseria Gonorrhea: NEGATIVE
Trichomonas: POSITIVE — AB

## 2019-05-05 MED ORDER — METRONIDAZOLE 500 MG PO TABS: 2000.0000 mg | ORAL_TABLET | Freq: Once | ORAL | 0 refills | Status: AC

## 2019-05-05 MED ORDER — METRONIDAZOLE 500 MG PO TABS: 2000.0000 mg | ORAL_TABLET | Freq: Once | ORAL | 0 refills | Status: DC

## 2019-05-05 NOTE — Telephone Encounter (Signed)
Attempted to call patient back concerning her positive cervicovaginal results.  Patient did not answer.  Will go ahead and send Flagyl to pharmacy for treatment and attempt another call back later today.

## 2019-08-10 DIAGNOSIS — Z3009 Encounter for other general counseling and advice on contraception: Secondary | ICD-10-CM | POA: Diagnosis not present

## 2019-08-10 DIAGNOSIS — Z113 Encounter for screening for infections with a predominantly sexual mode of transmission: Secondary | ICD-10-CM | POA: Diagnosis not present

## 2019-08-10 DIAGNOSIS — N760 Acute vaginitis: Secondary | ICD-10-CM | POA: Diagnosis not present

## 2019-08-10 DIAGNOSIS — Z1388 Encounter for screening for disorder due to exposure to contaminants: Secondary | ICD-10-CM | POA: Diagnosis not present

## 2019-08-10 DIAGNOSIS — Z0389 Encounter for observation for other suspected diseases and conditions ruled out: Secondary | ICD-10-CM | POA: Diagnosis not present

## 2019-08-10 DIAGNOSIS — Z114 Encounter for screening for human immunodeficiency virus [HIV]: Secondary | ICD-10-CM | POA: Diagnosis not present

## 2019-09-01 DIAGNOSIS — Z79899 Other long term (current) drug therapy: Secondary | ICD-10-CM | POA: Diagnosis not present

## 2019-09-04 DIAGNOSIS — Z79899 Other long term (current) drug therapy: Secondary | ICD-10-CM | POA: Diagnosis not present

## 2019-09-06 DIAGNOSIS — Z79899 Other long term (current) drug therapy: Secondary | ICD-10-CM | POA: Diagnosis not present

## 2019-09-22 ENCOUNTER — Ambulatory Visit (HOSPITAL_COMMUNITY)
Admission: EM | Admit: 2019-09-22 | Discharge: 2019-09-22 | Disposition: A | Discharge status: Home / Self Care | Payer: Medicaid Other | Attending: Family Medicine | Admitting: Family Medicine

## 2019-09-22 ENCOUNTER — Encounter (HOSPITAL_COMMUNITY): Payer: Self-pay

## 2019-09-22 DIAGNOSIS — K0889 Other specified disorders of teeth and supporting structures: Secondary | ICD-10-CM | POA: Diagnosis not present

## 2019-09-22 MED ORDER — HYDROCODONE-ACETAMINOPHEN 5-325 MG PO TABS: 1.0000 | ORAL_TABLET | Freq: Four times a day (QID) | ORAL | 0 refills | Status: DC | PRN

## 2019-09-22 MED ORDER — PENICILLIN V POTASSIUM 500 MG PO TABS: 500.0000 mg | ORAL_TABLET | Freq: Four times a day (QID) | ORAL | 0 refills | Status: AC

## 2019-09-22 NOTE — Discharge Instructions (Addendum)
Take the penicillin as directed Take pain medicine as needed See a dentist if pain persists
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
## 2019-09-22 NOTE — ED Triage Notes (Signed)
Pt presents with dental painsince last night. States was not able to sleep because of the pain.

## 2020-03-15 DIAGNOSIS — Z03818 Encounter for observation for suspected exposure to other biological agents ruled out: Secondary | ICD-10-CM | POA: Diagnosis not present

## 2020-05-06 DIAGNOSIS — Z113 Encounter for screening for infections with a predominantly sexual mode of transmission: Secondary | ICD-10-CM | POA: Diagnosis not present

## 2020-05-06 DIAGNOSIS — Z114 Encounter for screening for human immunodeficiency virus [HIV]: Secondary | ICD-10-CM | POA: Diagnosis not present

## 2020-05-06 DIAGNOSIS — N760 Acute vaginitis: Secondary | ICD-10-CM | POA: Diagnosis not present

## 2020-06-29 DIAGNOSIS — R590 Localized enlarged lymph nodes: Secondary | ICD-10-CM | POA: Diagnosis not present

## 2020-06-29 DIAGNOSIS — K529 Noninfective gastroenteritis and colitis, unspecified: Secondary | ICD-10-CM | POA: Diagnosis not present

## 2020-06-29 DIAGNOSIS — N760 Acute vaginitis: Secondary | ICD-10-CM | POA: Diagnosis not present

## 2020-09-18 DIAGNOSIS — R87810 Cervical high risk human papillomavirus (HPV) DNA test positive: Secondary | ICD-10-CM | POA: Diagnosis not present

## 2020-09-18 DIAGNOSIS — N920 Excessive and frequent menstruation with regular cycle: Secondary | ICD-10-CM | POA: Diagnosis not present

## 2020-09-18 DIAGNOSIS — Z0000 Encounter for general adult medical examination without abnormal findings: Secondary | ICD-10-CM | POA: Diagnosis not present

## 2020-09-18 DIAGNOSIS — Z113 Encounter for screening for infections with a predominantly sexual mode of transmission: Secondary | ICD-10-CM | POA: Diagnosis not present

## 2020-09-18 DIAGNOSIS — Z124 Encounter for screening for malignant neoplasm of cervix: Secondary | ICD-10-CM | POA: Diagnosis not present

## 2020-11-05 ENCOUNTER — Ambulatory Visit: Payer: Self-pay | Admitting: Surgery

## 2021-03-04 ENCOUNTER — Other Ambulatory Visit: Payer: Self-pay | Admitting: Surgery

## 2021-03-04 DIAGNOSIS — K644 Residual hemorrhoidal skin tags: Secondary | ICD-10-CM | POA: Diagnosis not present

## 2021-03-04 DIAGNOSIS — K643 Fourth degree hemorrhoids: Secondary | ICD-10-CM | POA: Diagnosis not present

## 2021-03-04 DIAGNOSIS — K649 Unspecified hemorrhoids: Secondary | ICD-10-CM | POA: Diagnosis not present

## 2021-04-01 DIAGNOSIS — Z114 Encounter for screening for human immunodeficiency virus [HIV]: Secondary | ICD-10-CM | POA: Diagnosis not present

## 2021-04-01 DIAGNOSIS — Z113 Encounter for screening for infections with a predominantly sexual mode of transmission: Secondary | ICD-10-CM | POA: Diagnosis not present

## 2021-04-01 DIAGNOSIS — N760 Acute vaginitis: Secondary | ICD-10-CM | POA: Diagnosis not present

## 2021-10-08 DIAGNOSIS — Z0000 Encounter for general adult medical examination without abnormal findings: Secondary | ICD-10-CM | POA: Diagnosis not present

## 2021-10-08 DIAGNOSIS — Z113 Encounter for screening for infections with a predominantly sexual mode of transmission: Secondary | ICD-10-CM | POA: Diagnosis not present

## 2023-05-26 ENCOUNTER — Ambulatory Visit (HOSPITAL_COMMUNITY)
Admission: EM | Admit: 2023-05-26 | Discharge: 2023-05-26 | Disposition: A | Discharge status: Home / Self Care | Payer: Self-pay | Attending: Sports Medicine | Admitting: Sports Medicine

## 2023-05-26 ENCOUNTER — Encounter (HOSPITAL_COMMUNITY): Payer: Self-pay | Admitting: Emergency Medicine

## 2023-05-26 ENCOUNTER — Other Ambulatory Visit: Payer: Self-pay

## 2023-05-26 DIAGNOSIS — Z3202 Encounter for pregnancy test, result negative: Secondary | ICD-10-CM | POA: Diagnosis not present

## 2023-05-26 DIAGNOSIS — R112 Nausea with vomiting, unspecified: Secondary | ICD-10-CM | POA: Diagnosis not present

## 2023-05-26 LAB — POCT URINALYSIS DIP (MANUAL ENTRY)
Bilirubin, UA: NEGATIVE
Blood, UA: NEGATIVE
Glucose, UA: NEGATIVE mg/dL
Leukocytes, UA: NEGATIVE
Nitrite, UA: NEGATIVE
Protein Ur, POC: 30 mg/dL — AB
Spec Grav, UA: 1.025
Urobilinogen, UA: 1 U/dL
pH, UA: 6

## 2023-05-26 LAB — POCT URINE PREGNANCY: Preg Test, Ur: NEGATIVE

## 2023-05-26 MED ORDER — ONDANSETRON 4 MG PO TBDP: 4.0000 mg | ORAL_TABLET | Freq: Three times a day (TID) | ORAL | 0 refills | Status: DC | PRN

## 2023-05-26 NOTE — ED Triage Notes (Signed)
 Complains of fever stomach pain, vomiting that started 4 days.  No more vomiting since yesterday evening.    Took night time flu and cold medicine.   Patient has been eating snacks and ginger ale in lobby

## 2023-05-26 NOTE — Discharge Instructions (Addendum)
 I suspect you have viral gastroenteritis (stomach bud). The most important thing is to stay well hydrated. I would focus on a bland diet (bananas, dry toast, eggs, soft/easy to digest foods) over the next week until symptoms resolve.  To treat you nausea, I have sent you a prescription for Zofran which you can continue to take every 8 hours as needed for nausea.   Seek emergent care if you develop severe abdominal pain, nausea/vomiting despite the Zofran and unable to keep fluids down, or blood in your vomit or stool.
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
## 2023-05-27 LAB — CYTOLOGY, (ORAL, ANAL, URETHRAL) ANCILLARY ONLY
Chlamydia: NEGATIVE
Comment: NEGATIVE
Comment: NEGATIVE
Comment: NORMAL
Neisseria Gonorrhea: NEGATIVE
Trichomonas: NEGATIVE

## 2023-06-26 ENCOUNTER — Ambulatory Visit (HOSPITAL_COMMUNITY)
Admission: EM | Admit: 2023-06-26 | Discharge: 2023-06-26 | Disposition: A | Discharge status: Home / Self Care | Attending: Emergency Medicine | Admitting: Emergency Medicine

## 2023-06-26 ENCOUNTER — Encounter (HOSPITAL_COMMUNITY): Payer: Self-pay

## 2023-06-26 DIAGNOSIS — Z202 Contact with and (suspected) exposure to infections with a predominantly sexual mode of transmission: Secondary | ICD-10-CM | POA: Diagnosis not present

## 2023-06-26 NOTE — Discharge Instructions (Signed)
 Today you were screened for some sexually transmitted infections.  Results will be back over the next few days and our staff will contact you if anything is abnormal.  Abstain from intercourse until results have been received and notify any sexual partners of any positive results.  Return to clinic for any new or urgent symptoms.
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
## 2023-06-26 NOTE — ED Triage Notes (Signed)
 Patient here today to be tested for Stds. Patient was told that she was exposed to Chlamydia.

## 2023-06-30 LAB — CERVICOVAGINAL ANCILLARY ONLY
Chlamydia: NEGATIVE
Comment: NEGATIVE
Comment: NEGATIVE
Comment: NORMAL
Neisseria Gonorrhea: NEGATIVE
Trichomonas: NEGATIVE
